# Patient Record
Sex: Female | Born: 1955 | Race: White | Hispanic: No | Marital: Married | Smoking: Former smoker
Health system: Southern US, Community
[De-identification: ages and names within clinical notes are randomized; demographics above are authoritative.]

## PROBLEM LIST (undated history)

## (undated) DIAGNOSIS — K219 Gastro-esophageal reflux disease without esophagitis: Secondary | ICD-10-CM

## (undated) DIAGNOSIS — M329 Systemic lupus erythematosus, unspecified: Secondary | ICD-10-CM

## (undated) DIAGNOSIS — R0600 Dyspnea, unspecified: Secondary | ICD-10-CM

## (undated) DIAGNOSIS — J42 Unspecified chronic bronchitis: Secondary | ICD-10-CM

## (undated) DIAGNOSIS — Z8719 Personal history of other diseases of the digestive system: Secondary | ICD-10-CM

## (undated) DIAGNOSIS — Z972 Presence of dental prosthetic device (complete) (partial): Secondary | ICD-10-CM

## (undated) DIAGNOSIS — J189 Pneumonia, unspecified organism: Secondary | ICD-10-CM

## (undated) DIAGNOSIS — IMO0002 Reserved for concepts with insufficient information to code with codable children: Secondary | ICD-10-CM

## (undated) DIAGNOSIS — M7751 Other enthesopathy of right foot: Secondary | ICD-10-CM

## (undated) DIAGNOSIS — D649 Anemia, unspecified: Secondary | ICD-10-CM

## (undated) DIAGNOSIS — R053 Chronic cough: Secondary | ICD-10-CM

## (undated) DIAGNOSIS — R05 Cough: Secondary | ICD-10-CM

## (undated) DIAGNOSIS — M1990 Unspecified osteoarthritis, unspecified site: Secondary | ICD-10-CM

## (undated) DIAGNOSIS — M7752 Other enthesopathy of left foot: Secondary | ICD-10-CM

## (undated) DIAGNOSIS — Z8711 Personal history of peptic ulcer disease: Secondary | ICD-10-CM

## (undated) HISTORY — PX: FOOT SURGERY: SHX648

## (undated) HISTORY — PX: STOMACH SURGERY: SHX791

---

## 2004-11-17 ENCOUNTER — Ambulatory Visit: Payer: Self-pay

## 2005-06-01 ENCOUNTER — Ambulatory Visit: Payer: Self-pay

## 2006-08-06 ENCOUNTER — Ambulatory Visit: Payer: Self-pay | Admitting: Pain Medicine

## 2008-02-07 ENCOUNTER — Ambulatory Visit: Payer: Self-pay | Admitting: Internal Medicine

## 2010-02-27 HISTORY — PX: CHOLECYSTECTOMY: SHX55

## 2010-12-16 ENCOUNTER — Ambulatory Visit: Payer: Self-pay | Admitting: Otolaryngology

## 2011-03-09 DIAGNOSIS — R0602 Shortness of breath: Secondary | ICD-10-CM | POA: Insufficient documentation

## 2011-03-29 DIAGNOSIS — M5137 Other intervertebral disc degeneration, lumbosacral region: Secondary | ICD-10-CM | POA: Insufficient documentation

## 2011-03-29 DIAGNOSIS — M797 Fibromyalgia: Secondary | ICD-10-CM | POA: Insufficient documentation

## 2011-03-29 DIAGNOSIS — M329 Systemic lupus erythematosus, unspecified: Secondary | ICD-10-CM | POA: Insufficient documentation

## 2011-08-04 DIAGNOSIS — Z79899 Other long term (current) drug therapy: Secondary | ICD-10-CM | POA: Insufficient documentation

## 2012-01-16 DIAGNOSIS — H905 Unspecified sensorineural hearing loss: Secondary | ICD-10-CM | POA: Insufficient documentation

## 2012-01-16 DIAGNOSIS — H6980 Other specified disorders of Eustachian tube, unspecified ear: Secondary | ICD-10-CM | POA: Insufficient documentation

## 2012-03-19 DIAGNOSIS — M791 Myalgia, unspecified site: Secondary | ICD-10-CM | POA: Insufficient documentation

## 2012-03-19 DIAGNOSIS — F341 Dysthymic disorder: Secondary | ICD-10-CM | POA: Insufficient documentation

## 2012-03-19 DIAGNOSIS — J019 Acute sinusitis, unspecified: Secondary | ICD-10-CM | POA: Insufficient documentation

## 2012-08-06 DIAGNOSIS — H66009 Acute suppurative otitis media without spontaneous rupture of ear drum, unspecified ear: Secondary | ICD-10-CM | POA: Insufficient documentation

## 2012-08-14 DIAGNOSIS — M79603 Pain in arm, unspecified: Secondary | ICD-10-CM | POA: Insufficient documentation

## 2012-08-14 DIAGNOSIS — Z0000 Encounter for general adult medical examination without abnormal findings: Secondary | ICD-10-CM | POA: Insufficient documentation

## 2012-08-14 DIAGNOSIS — M722 Plantar fascial fibromatosis: Secondary | ICD-10-CM | POA: Insufficient documentation

## 2012-08-14 DIAGNOSIS — R300 Dysuria: Secondary | ICD-10-CM | POA: Insufficient documentation

## 2012-09-15 DIAGNOSIS — M542 Cervicalgia: Secondary | ICD-10-CM | POA: Insufficient documentation

## 2014-03-24 ENCOUNTER — Encounter: Payer: Self-pay | Admitting: Otolaryngology

## 2014-03-30 ENCOUNTER — Encounter: Payer: Self-pay | Admitting: Otolaryngology

## 2014-04-28 ENCOUNTER — Encounter
Admit: 2014-04-28 | Disposition: A | Discharge status: Home / Self Care | Payer: Self-pay | Attending: Otolaryngology | Admitting: Otolaryngology

## 2014-05-29 ENCOUNTER — Encounter
Admit: 2014-05-29 | Disposition: A | Discharge status: Home / Self Care | Payer: Self-pay | Attending: Otolaryngology | Admitting: Otolaryngology

## 2014-09-29 ENCOUNTER — Ambulatory Visit
Admission: RE | Admit: 2014-09-29 | Discharge: 2014-09-29 | Disposition: A | Discharge status: Home / Self Care | Payer: 59 | Source: Ambulatory Visit | Attending: Otolaryngology | Admitting: Otolaryngology

## 2014-09-29 ENCOUNTER — Other Ambulatory Visit: Payer: Self-pay | Admitting: Otolaryngology

## 2014-09-29 DIAGNOSIS — R05 Cough: Secondary | ICD-10-CM | POA: Insufficient documentation

## 2014-09-29 DIAGNOSIS — R059 Cough, unspecified: Secondary | ICD-10-CM

## 2015-02-28 DIAGNOSIS — J189 Pneumonia, unspecified organism: Secondary | ICD-10-CM

## 2015-02-28 HISTORY — DX: Pneumonia, unspecified organism: J18.9

## 2015-03-10 ENCOUNTER — Other Ambulatory Visit: Payer: Self-pay | Admitting: Orthopedic Surgery

## 2015-03-10 DIAGNOSIS — M5412 Radiculopathy, cervical region: Secondary | ICD-10-CM

## 2015-03-10 DIAGNOSIS — M25512 Pain in left shoulder: Secondary | ICD-10-CM

## 2015-03-16 ENCOUNTER — Ambulatory Visit
Admission: RE | Admit: 2015-03-16 | Discharge: 2015-03-16 | Disposition: A | Discharge status: Home / Self Care | Payer: Commercial Managed Care - HMO | Source: Ambulatory Visit | Attending: Orthopedic Surgery | Admitting: Orthopedic Surgery

## 2015-03-16 DIAGNOSIS — M2578 Osteophyte, vertebrae: Secondary | ICD-10-CM | POA: Diagnosis not present

## 2015-03-16 DIAGNOSIS — M5412 Radiculopathy, cervical region: Secondary | ICD-10-CM | POA: Insufficient documentation

## 2015-03-26 ENCOUNTER — Ambulatory Visit
Admission: RE | Admit: 2015-03-26 | Discharge: 2015-03-26 | Disposition: A | Discharge status: Home / Self Care | Payer: 59 | Source: Ambulatory Visit | Attending: Orthopedic Surgery | Admitting: Orthopedic Surgery

## 2015-03-26 DIAGNOSIS — M25512 Pain in left shoulder: Secondary | ICD-10-CM | POA: Diagnosis present

## 2015-03-26 DIAGNOSIS — M25412 Effusion, left shoulder: Secondary | ICD-10-CM | POA: Insufficient documentation

## 2015-04-27 DIAGNOSIS — M79604 Pain in right leg: Secondary | ICD-10-CM | POA: Insufficient documentation

## 2015-08-11 ENCOUNTER — Ambulatory Visit (INDEPENDENT_AMBULATORY_CARE_PROVIDER_SITE_OTHER): Payer: Commercial Managed Care - HMO | Admitting: Podiatry

## 2015-08-11 ENCOUNTER — Encounter: Payer: Self-pay | Admitting: Podiatry

## 2015-08-11 ENCOUNTER — Ambulatory Visit (INDEPENDENT_AMBULATORY_CARE_PROVIDER_SITE_OTHER): Payer: Commercial Managed Care - HMO

## 2015-08-11 ENCOUNTER — Ambulatory Visit: Payer: Self-pay

## 2015-08-11 VITALS — BP 125/70 | HR 85 | Resp 16

## 2015-08-11 DIAGNOSIS — M779 Enthesopathy, unspecified: Secondary | ICD-10-CM

## 2015-08-11 DIAGNOSIS — M71572 Other bursitis, not elsewhere classified, left ankle and foot: Secondary | ICD-10-CM

## 2015-08-11 DIAGNOSIS — M7752 Other enthesopathy of left foot: Secondary | ICD-10-CM

## 2015-08-11 NOTE — Progress Notes (Signed)
   Subjective:    Patient ID: Brittany Vaughan, female    DOB: 11-Feb-1956, 60 y.o.   MRN: [id number]  HPI: She presents today with a chief complaint of pain and a nodule to the lateral aspect of the left foot. She states that the pain has been increasing over the past several months and the foot appears to be swollen on the outside. She states it is worse with standing and walking really doesn't bother her very much if she is not weightbearing. She denies any trauma to the foot. States that it has worsened since she had surgery to the left foot where she had an implant into her subtalar joint. I requested information regarding the surgery but she declined to get into any because she did not want me to know who did the surgery.    Review of Systems  HENT: Positive for ear pain, hearing loss and tinnitus.   Eyes: Positive for pain and visual disturbance.  Cardiovascular: Positive for leg swelling.  Genitourinary: Positive for frequency.  Musculoskeletal: Positive for myalgias, back pain, arthralgias and gait problem.  Neurological: Positive for dizziness, light-headedness and numbness.  All other systems reviewed and are negative.      Objective:   Physical Exam: Vital signs are stable she is alert and oriented 3 neurologic sensorium is intact deep tendon reflexes are intact muscle strength is intact pulses are intact muscle strength is normal orthopedic evaluation and states all joints distal to the ankle for range of motion without crepitation. She has a very prominent fifth metatarsal with an overlying soft pliable fluctuant lesion consistent with bursitis. There is some mild overlying erythema no cellulitis drainage or odor. 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 There is overlying soft tissue swelling. Cutaneous evaluation demonstrates no erythema cellulitis drainage or odor with exception of that  overlying the fifth metatarsal base. No open lesions or wounds.          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  Plan: I injected the area today with dexamethasone and local anesthetic to help alleviate the bursitis. I recommended orthotics however she is requesting that we removed the device and the subtalar joint. I will see again requested information regarding the surgery itself she declined. I will try to find based on radiographs this device so that we may remove it in the future. I will follow up with her in a month or so reevaluation.

## 2015-08-28 HISTORY — PX: NECK SURGERY: SHX720

## 2016-02-09 ENCOUNTER — Ambulatory Visit
Admission: RE | Admit: 2016-02-09 | Discharge: 2016-02-09 | Disposition: A | Discharge status: Home / Self Care | Payer: Commercial Managed Care - HMO | Source: Ambulatory Visit | Attending: Family Medicine | Admitting: Family Medicine

## 2016-02-09 ENCOUNTER — Other Ambulatory Visit: Payer: Self-pay | Admitting: Family Medicine

## 2016-02-09 DIAGNOSIS — R05 Cough: Secondary | ICD-10-CM

## 2016-02-09 DIAGNOSIS — R059 Cough, unspecified: Secondary | ICD-10-CM

## 2016-04-24 ENCOUNTER — Ambulatory Visit
Admission: RE | Admit: 2016-04-24 | Discharge: 2016-04-24 | Disposition: A | Discharge status: Home / Self Care | Payer: 59 | Source: Ambulatory Visit | Attending: Family Medicine | Admitting: Family Medicine

## 2016-04-24 ENCOUNTER — Other Ambulatory Visit: Payer: Self-pay | Admitting: Family Medicine

## 2016-04-24 DIAGNOSIS — R05 Cough: Secondary | ICD-10-CM | POA: Insufficient documentation

## 2016-04-24 DIAGNOSIS — R059 Cough, unspecified: Secondary | ICD-10-CM

## 2016-06-29 ENCOUNTER — Encounter: Payer: Self-pay | Admitting: *Deleted

## 2016-07-03 NOTE — Discharge Instructions (Signed)
[facility] °DISCHARGE INSTRUCTIONS FOR MYRINGOTOMY AND TUBE INSERTION ° °[facility] °PAUL JUENGEL, M.D. °CHAPMAN T. MCQUEEN, M.D. °SCOTT BENNETT, M.D. °CREIGHTON VAUGHT, M.D. ° °Diet:   After surgery, the patient should take only liquids and foods as tolerated.  The patient may then have a regular diet after the effects of anesthesia have worn off, usually about four to six hours after surgery. ° °Activities:   The patient should rest until the effects of anesthesia have worn off.  After this, there are no restrictions on the normal daily activities. ° °Medications:   You will be given antibiotic drops to be used in the ears postoperatively.  It is recommended to use 4 drops 2 times a day for 4 days, then the drops should be saved for possible future use. ° °The tubes should not cause any discomfort to the patient, but if there is any question, Tylenol should be given according to the instructions for the age of the patient. ° °Other medications should be continued normally. ° °Precautions:   Should there be recurrent drainage after the tubes are placed, the drops should be used for approximately 3-4 days.  If it does not clear, you should call the ENT office. ° °Earplugs:   Earplugs are only needed for those who are going to be submerged under water.  When taking a bath or shower and using a cup or showerhead to rinse hair, it is not necessary to wear earplugs.  These come in a variety of fashions, all of which can be obtained at our office.  However, if one is not able to come by the office, then silicone plugs can be found at most pharmacies.  It is not advised to stick anything in the ear that is not approved as an earplug.  Silly putty is not to be used as an earplug.  Swimming is allowed in patients after ear tubes are inserted, however, they must wear earplugs if they are going to be submerged under water.  For those children who are going to be swimming a lot, it is  recommended to use a fitted ear mold, which can be made by our audiologist.  If discharge is noticed from the ears, this most likely represents an ear infection.  We would recommend getting your eardrops and using them as indicated above.  If it does not clear, then you should call the ENT office.  For follow up, the patient should return to the ENT office three weeks postoperatively and then every six months as required by the doctor. ° ° °General Anesthesia, Adult, Care After °These instructions provide you with information about caring for yourself after your procedure. Your health care provider may also give you more specific instructions. Your treatment has been planned according to current medical practices, but problems sometimes occur. Call your health care provider if you have any problems or questions after your procedure. °What can I expect after the procedure? °After the procedure, it is common to have: °· Vomiting. °· A sore throat. °· Mental slowness. °It is common to feel: °· Nauseous. °· Cold or shivery. °· Sleepy. °· Tired. °· Sore or achy, even in parts of your body where you did not have surgery. °Follow these instructions at home: °For at least 24 hours after the procedure:  °· Do not: °¨ Participate in activities where you could fall or become injured. °¨ Drive. °¨ Use heavy machinery. °¨ Drink alcohol. °¨ Take sleeping pills or medicines   that cause drowsiness. °¨ Make important decisions or sign legal documents. °¨ Take care of children on your own. °· Rest. °Eating and drinking  °· If you vomit, drink water, juice, or soup when you can drink without vomiting. °· Drink enough fluid to keep your urine clear or pale yellow. °· Make sure you have little or no nausea before eating solid foods. °· Follow the diet recommended by your health care provider. °General instructions  °· Have a responsible adult stay with you until you are awake and alert. °· Return to your normal activities as told by your  health care provider. Ask your health care provider what activities are safe for you. °· Take over-the-counter and prescription medicines only as told by your health care provider. °· If you smoke, do not smoke without supervision. °· Keep all follow-up visits as told by your health care provider. This is important. °Contact a health care provider if: °· You continue to have nausea or vomiting at home, and medicines are not helpful. °· You cannot drink fluids or start eating again. °· You cannot urinate after 8-12 hours. °· You develop a skin rash. °· You have fever. °· You have increasing redness at the site of your procedure. °Get help right away if: °· You have difficulty breathing. °· You have chest pain. °· You have unexpected bleeding. °· You feel that you are having a life-threatening or urgent problem. °This information is not intended to replace advice given to you by your health care provider. Make sure you discuss any questions you have with your health care provider. °Document Released: 05/22/2000 Document Revised: 07/19/2015 Document Reviewed: 01/28/2015 °Elsevier Interactive Patient Education © 2017 [employer] ° °

## 2016-07-05 ENCOUNTER — Ambulatory Visit
Admission: RE | Admit: 2016-07-05 | Discharge: 2016-07-05 | Disposition: A | Discharge status: Home / Self Care | Payer: 59 | Source: Ambulatory Visit | Attending: Otolaryngology | Admitting: Otolaryngology

## 2016-07-05 ENCOUNTER — Ambulatory Visit: Payer: 59 | Admitting: Anesthesiology

## 2016-07-05 ENCOUNTER — Encounter
Admission: RE | Disposition: A | Discharge status: Home / Self Care | Payer: Self-pay | Source: Ambulatory Visit | Attending: Otolaryngology

## 2016-07-05 DIAGNOSIS — H6983 Other specified disorders of Eustachian tube, bilateral: Secondary | ICD-10-CM | POA: Diagnosis present

## 2016-07-05 DIAGNOSIS — Z87891 Personal history of nicotine dependence: Secondary | ICD-10-CM | POA: Insufficient documentation

## 2016-07-05 DIAGNOSIS — K219 Gastro-esophageal reflux disease without esophagitis: Secondary | ICD-10-CM | POA: Insufficient documentation

## 2016-07-05 DIAGNOSIS — H73892 Other specified disorders of tympanic membrane, left ear: Secondary | ICD-10-CM | POA: Insufficient documentation

## 2016-07-05 HISTORY — DX: Dyspnea, unspecified: R06.00

## 2016-07-05 HISTORY — DX: Other enthesopathy of right foot and ankle: M77.52

## 2016-07-05 HISTORY — DX: Unspecified osteoarthritis, unspecified site: M19.90

## 2016-07-05 HISTORY — DX: Other enthesopathy of right foot and ankle: M77.51

## 2016-07-05 HISTORY — DX: Reserved for concepts with insufficient information to code with codable children: IMO0002

## 2016-07-05 HISTORY — DX: Systemic lupus erythematosus, unspecified: M32.9

## 2016-07-05 HISTORY — PX: MYRINGOTOMY WITH TUBE PLACEMENT: SHX5663

## 2016-07-05 HISTORY — DX: Gastro-esophageal reflux disease without esophagitis: K21.9

## 2016-07-05 HISTORY — DX: Unspecified chronic bronchitis: J42

## 2016-07-05 HISTORY — DX: Personal history of peptic ulcer disease: Z87.11

## 2016-07-05 HISTORY — DX: Personal history of other diseases of the digestive system: Z87.19

## 2016-07-05 HISTORY — DX: Presence of dental prosthetic device (complete) (partial): Z97.2

## 2016-07-05 SURGERY — MYRINGOTOMY WITH TUBE PLACEMENT
Anesthesia: General | Site: Ear | Laterality: Bilateral | Wound class: Clean Contaminated

## 2016-07-05 MED ORDER — LACTATED RINGERS IV SOLN
INTRAVENOUS | Status: DC
  Administered 2016-07-05: 09:00:00 via INTRAVENOUS

## 2016-07-05 MED ORDER — LIDOCAINE HCL (CARDIAC) 10 MG/ML IV SOLN
INTRAVENOUS | Status: DC | PRN
  Administered 2016-07-05: 40 mg via INTRAVENOUS

## 2016-07-05 MED ORDER — PROPOFOL 10 MG/ML IV BOLUS
INTRAVENOUS | Status: DC | PRN
  Administered 2016-07-05: 100 mg via INTRAVENOUS
  Administered 2016-07-05 (×2): 50 mg via INTRAVENOUS

## 2016-07-05 MED ORDER — ACETAMINOPHEN 325 MG PO TABS: 325.0000 mg | ORAL_TABLET | ORAL | Status: DC | PRN

## 2016-07-05 MED ORDER — ACETAMINOPHEN 160 MG/5ML PO SOLN: 325.0000 mg | ORAL | Status: DC | PRN

## 2016-07-05 MED ORDER — CIPROFLOXACIN-DEXAMETHASONE 0.3-0.1 % OT SUSP
OTIC | Status: DC | PRN
  Administered 2016-07-05: 4 [drp] via OTIC

## 2016-07-05 SURGICAL SUPPLY — 11 items

## 2016-07-05 NOTE — H&P (Signed)
..  History and Physical paper copy reviewed and updated date of procedure and will be scanned into system.  Patient seen and examined.  

## 2016-07-05 NOTE — Anesthesia Postprocedure Evaluation (Signed)
Anesthesia Post Note  Patient: Brittany HawkingLinda S Vaughan  Procedure(s) Performed: Procedure(s) (LRB): MYRINGOTOMY WITH TUBE PLACEMENT WITH BUTTERFLY (Bilateral)  Patient location during evaluation: PACU Anesthesia Type: General Level of consciousness: awake and alert Pain management: pain level controlled Vital Signs Assessment: post-procedure vital signs reviewed and stable Respiratory status: spontaneous breathing, nonlabored ventilation, respiratory function stable and patient connected to nasal cannula oxygen Cardiovascular status: blood pressure returned to baseline and stable Postop Assessment: no signs of nausea or vomiting Anesthetic complications: no    Alta CorningBacon, Clarity Ciszek S

## 2016-07-05 NOTE — Anesthesia Procedure Notes (Signed)
Performed by: Andee PolesBUSH, Maira Christon Pre-anesthesia Checklist: Patient identified, Emergency Drugs available, Suction available, Timeout performed and Patient being monitored Patient Re-evaluated:Patient Re-evaluated prior to inductionOxygen Delivery Method: Circle system utilized Preoxygenation: Pre-oxygenation with 100% oxygen Intubation Type: Combination inhalational/ intravenous induction Ventilation: Mask ventilation without difficulty and Mask ventilation throughout procedure Dental Injury: Teeth and Oropharynx as per pre-operative assessment

## 2016-07-05 NOTE — Anesthesia Preprocedure Evaluation (Signed)
Anesthesia Evaluation  Patient identified by MRN, date of birth, ID band Patient awake    Reviewed: Allergy & Precautions, H&P , NPO status , Patient's Chart, lab work & pertinent test results, reviewed documented beta blocker date and time   Airway Mallampati: I  TM Distance: >3 FB Neck ROM: full    Dental  (+) Partial Upper   Pulmonary shortness of breath and with exertion, former smoker,    + rhonchi        Cardiovascular Exercise Tolerance: Good negative cardio ROS Normal cardiovascular exam Rhythm:regular Rate:Normal     Neuro/Psych negative neurological ROS  negative psych ROS   GI/Hepatic Neg liver ROS, GERD  Poorly Controlled,Reflux seems to be "ok" today.   Endo/Other  negative endocrine ROS  Renal/GU negative Renal ROS  negative genitourinary   Musculoskeletal   Abdominal   Peds  Hematology negative hematology ROS (+)   Anesthesia Other Findings   Reproductive/Obstetrics negative OB ROS                             Anesthesia Physical Anesthesia Plan  ASA: III  Anesthesia Plan: General   Post-op Pain Management:    Induction:   Airway Management Planned:   Additional Equipment:   Intra-op Plan:   Post-operative Plan:   Informed Consent: I have reviewed the patients History and Physical, chart, labs and discussed the procedure including the risks, benefits and alternatives for the proposed anesthesia with the patient or authorized representative who has indicated his/her understanding and acceptance.   Dental Advisory Given  Plan Discussed with: CRNA and Anesthesiologist  Anesthesia Plan Comments:         Anesthesia Quick Evaluation
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
## 2016-07-05 NOTE — Transfer of Care (Signed)
Immediate Anesthesia Transfer of Care Note  Patient: Brittany HawkingLinda S Vaughan  Procedure(s) Performed: Procedure(s) with comments: MYRINGOTOMY WITH TUBE PLACEMENT WITH BUTTERFLY (Bilateral) - BUTTERFLY TUBES  Patient Location: PACU  Anesthesia Type: General  Level of Consciousness: awake, alert  and patient cooperative  Airway and Oxygen Therapy: Patient Spontanous Breathing and Patient connected to supplemental oxygen  Post-op Assessment: Post-op Vital signs reviewed, Patient's Cardiovascular Status Stable, Respiratory Function Stable, Patent Airway and No signs of Nausea or vomiting  Post-op Vital Signs: Reviewed and stable  Complications: No apparent anesthesia complications

## 2016-07-05 NOTE — Op Note (Signed)
..  07/05/2016  9:41 AM    Brittany Vaughan, Brittany Vaughan  [id number]   Pre-Op Dx:  EUSTACHIAN TUBE DYSFUNCTION  Post-op Dx: EUSTACHIAN TUBE DYSFUNCTION  Proc:Bilateral myringotomy with butterfly tubes  Surg: Graciella Arment  Anes:  General by mask  EBL:  None  Comp:  None  Findings:  Right mucoid effusion, left retraction with myringosclerosis  Procedure: With the patient in a comfortable supine position, general mask anesthesia was administered.  At an appropriate level, microscope and speculum were used to examine and clean the RIGHT ear canal.  The findings were as described above.  An anterior inferior radial myringotomy incision was sharply executed.  Middle ear contents were suctioned clear with a size 5 otologic suction.  A butterfly PE tube was placed without difficulty using a Rosen pick and Facilities manageralligator.  Ciprodex otic solution was instilled into the external canal, and insufflated into the middle ear.  A cotton ball was placed at the external meatus. Hemostasis was observed.  This side was completed.  After completing the RIGHT side, the LEFT side was done in identical fashion except the myringotomy was placed in a posterior-inferior position.  Following this  The patient was returned to anesthesia, awakened, and transferred to recovery in stable condition.  Dispo:  PACU to home  Plan: Routine drop use and water precautions.  Recheck my office three weeks.   [name] 9:41 AM 07/05/2016

## 2016-07-06 ENCOUNTER — Encounter: Payer: Self-pay | Admitting: Otolaryngology

## 2016-11-21 ENCOUNTER — Ambulatory Visit: Payer: Self-pay | Admitting: Orthopedic Surgery

## 2016-12-04 ENCOUNTER — Encounter
Admission: RE | Admit: 2016-12-04 | Discharge: 2016-12-04 | Disposition: A | Discharge status: Home / Self Care | Payer: 59 | Source: Ambulatory Visit | Attending: Orthopedic Surgery | Admitting: Orthopedic Surgery

## 2016-12-04 DIAGNOSIS — Z01810 Encounter for preprocedural cardiovascular examination: Secondary | ICD-10-CM | POA: Insufficient documentation

## 2016-12-04 DIAGNOSIS — R002 Palpitations: Secondary | ICD-10-CM | POA: Insufficient documentation

## 2016-12-04 HISTORY — DX: Pneumonia, unspecified organism: J18.9

## 2016-12-04 HISTORY — DX: Anemia, unspecified: D64.9

## 2016-12-04 HISTORY — DX: Chronic cough: R05.3

## 2016-12-04 HISTORY — DX: Cough: R05

## 2016-12-04 NOTE — Patient Instructions (Signed)
Your procedure is scheduled on: 12-11-16 MONDAY Report to Same Day Surgery 2nd floor medical mall [facility] Entrance-take elevator on left to 2nd floor.  Check in with surgery information desk.) To find out your arrival time please call [phone] between 1PM - 3PM on 12-08-16 FRIDAY  Remember: Instructions that are not followed completely may result in serious medical risk, up to and including death, or upon the discretion of your surgeon and anesthesiologist your surgery may need to be rescheduled.    _x___ 1. Do not eat food after midnight the night before your procedure. NO GUM CHEWING OR HARD CANDIES.  You may drink clear liquids up to 2 hours before you are scheduled to arrive at the hospital for your procedure.  Do not drink clear liquids within 2 hours of your scheduled arrival to the hospital.  Clear liquids include  --Water or Apple juice without pulp  --Clear carbohydrate beverage such as ClearFast or Gatorade  --Black Coffee or Clear Tea (No milk, no creamers, do not add anything to  the coffee or Tea)  Type 1 and type 2 diabetics should only drink water. .     __x__ 2. No Alcohol for 24 hours before or after surgery.   __x__3. No Smoking for 24 prior to surgery.   ____  4. Bring all medications with you on the day of surgery if instructed.    __x__ 5. Notify your doctor if there is any change in your medical condition     (cold, fever, infections).     Do not wear jewelry, make-up, hairpins, clips or nail polish.  Do not wear lotions, powders, or perfumes. You may wear deodorant.  Do not shave 48 hours prior to surgery. Men may shave face and neck.  Do not bring valuables to the hospital.    [facility] is not responsible for any belongings or valuables.               Contacts, dentures or bridgework may not be worn into surgery.  Leave your suitcase in the car. After surgery it may be brought to your room.  For patients admitted to the hospital, discharge time  is determined by your  treatment team.   Patients discharged the day of surgery will not be allowed to drive home.  You will need someone to drive you home and stay with you the night of your procedure.    Please read over the following fact sheets that you were given:   [facility] Preparing for Surgery and or MRSA Information   ____ TAKE THE FOLLOWING MEDICATIONS THE MORNING OF SURGERY WITH A SMALL SIP OF WATER. These include:  1. NONE  2.  3.  4.  5.  6.  ____Fleets enema or Magnesium Citrate as directed.   _x___ Use CHG Soap or sage wipes as directed on instruction sheet   __X__ Use inhalers on the day of surgery and bring to hospital day of surgery-USE ALBUTEROL INHALER AT HOME DAY OF SURGERY AND BRING INHALER TO HOSPITAL  ____ Stop Metformin and Janumet 2 days prior to surgery.    ____ Take 1/2 of usual insulin dose the night before surgery and none on the morning surgery.   ____ Follow recommendations from Cardiologist, Pulmonologist or PCP regarding stopping Aspirin, Coumadin, Plavix ,Eliquis, Effient, or Pradaxa, and Pletal.  X____Stop Anti-inflammatories such as Advil, Aleve, IBUPROFEN, Motrin, Naproxen, Naprosyn, Goodies powders or aspirin products NOW- OK to take Tylenol    ____ Stop  supplements until after surgery.     ____ Bring C-Pap to the hospital.
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
## 2016-12-06 ENCOUNTER — Encounter
Admission: RE | Admit: 2016-12-06 | Discharge: 2016-12-06 | Disposition: A | Discharge status: Home / Self Care | Payer: 59 | Source: Ambulatory Visit | Attending: Orthopedic Surgery | Admitting: Orthopedic Surgery

## 2016-12-06 DIAGNOSIS — R002 Palpitations: Secondary | ICD-10-CM | POA: Diagnosis not present

## 2016-12-06 DIAGNOSIS — Z01810 Encounter for preprocedural cardiovascular examination: Secondary | ICD-10-CM | POA: Diagnosis present

## 2016-12-06 NOTE — Pre-Procedure Instructions (Signed)
DG Chest 2 View (Accession [id number]) (Order [id number])  Imaging  Date: 04/24/2016 Department: [facility] Released By: Vilinda Boehringer, RT Authorizing: Dortha Kern, MD  Exam Information   Status Exam Begun  Exam Ended   Final [99] 04/24/2016 11:17 AM 04/24/2016 11:21 AM  PACS Images   Show images for DG Chest 2 View  Study Result   CLINICAL DATA:  Episode of pneumonia in early December 2017. Since that time she has had intermittent productive cough, shortness of breath, chills and diaphoresis, fever, and upper chest pain. The patient has undergone 3 courses of antibiotics. Remote history of smoking.  EXAM: CHEST  2 VIEW  COMPARISON:  Chest x-rays of February 09, 2016 and September 29, 2014  FINDINGS: The lungs are well-expanded. There is no focal infiltrate. There is no pleural effusion. The heart and pulmonary vascularity are normal. The mediastinum is normal in width. The bony thorax exhibits no acute abnormality.  IMPRESSION: There is no active cardiopulmonary disease demonstrated on today's study. Given the patient's chronic symptoms, chest CT scanning would be a useful next imaging step.   Electronically Signed   By: David  [place] M.D.   On: 04/24/2016 11:28   Order-Level Documents:   There are no order-level documents.  Encounter-Level Documents:   There are no encounter-level documents.  Vitals   Height Weight BMI (Calculated)      External Results Report   Open External Results Report  Imaging   Imaging Information  Signed by   Signed Date/Time  Phone Pager  [place], DAVID A 04/24/2016 11:28 AM [phone]   Signed   Electronically signed by [place], David A, MD on 04/24/16 at 1128 EST  Study Notes     Vilinda Boehringer, RT on 04/24/2016 11:19 AM  Pt has had pneumonia on dec 1st 2017, she has had prod cough with fever, sob, sweating/chills and upper chest pain off and on since. S/p abx x 3 times. exsmoker x 40 years  ago.    Original Order   Ordered On Ordered By   04/24/2016 11:11 AM Vilinda Boehringer, RT

## 2016-12-08 NOTE — Pre-Procedure Instructions (Signed)
Medical Clearance on chart from Dr Jeannetta Nap Risk

## 2016-12-11 ENCOUNTER — Ambulatory Visit: Payer: 59 | Admitting: Anesthesiology

## 2016-12-11 ENCOUNTER — Encounter: Payer: Self-pay | Admitting: *Deleted

## 2016-12-11 ENCOUNTER — Encounter
Admission: RE | Disposition: A | Discharge status: Home / Self Care | Payer: Self-pay | Source: Ambulatory Visit | Attending: Orthopedic Surgery

## 2016-12-11 ENCOUNTER — Ambulatory Visit
Admission: RE | Admit: 2016-12-11 | Discharge: 2016-12-11 | Disposition: A | Discharge status: Home / Self Care | Payer: 59 | Source: Ambulatory Visit | Attending: Orthopedic Surgery | Admitting: Orthopedic Surgery

## 2016-12-11 DIAGNOSIS — J449 Chronic obstructive pulmonary disease, unspecified: Secondary | ICD-10-CM | POA: Insufficient documentation

## 2016-12-11 DIAGNOSIS — M7552 Bursitis of left shoulder: Secondary | ICD-10-CM | POA: Insufficient documentation

## 2016-12-11 DIAGNOSIS — Z87891 Personal history of nicotine dependence: Secondary | ICD-10-CM | POA: Insufficient documentation

## 2016-12-11 DIAGNOSIS — M7582 Other shoulder lesions, left shoulder: Secondary | ICD-10-CM | POA: Insufficient documentation

## 2016-12-11 DIAGNOSIS — M75122 Complete rotator cuff tear or rupture of left shoulder, not specified as traumatic: Secondary | ICD-10-CM | POA: Diagnosis not present

## 2016-12-11 DIAGNOSIS — M797 Fibromyalgia: Secondary | ICD-10-CM | POA: Diagnosis not present

## 2016-12-11 DIAGNOSIS — Z882 Allergy status to sulfonamides status: Secondary | ICD-10-CM | POA: Diagnosis not present

## 2016-12-11 DIAGNOSIS — Z79899 Other long term (current) drug therapy: Secondary | ICD-10-CM | POA: Diagnosis not present

## 2016-12-11 DIAGNOSIS — F329 Major depressive disorder, single episode, unspecified: Secondary | ICD-10-CM | POA: Insufficient documentation

## 2016-12-11 DIAGNOSIS — K769 Liver disease, unspecified: Secondary | ICD-10-CM | POA: Insufficient documentation

## 2016-12-11 DIAGNOSIS — M1990 Unspecified osteoarthritis, unspecified site: Secondary | ICD-10-CM | POA: Insufficient documentation

## 2016-12-11 DIAGNOSIS — K219 Gastro-esophageal reflux disease without esophagitis: Secondary | ICD-10-CM | POA: Diagnosis not present

## 2016-12-11 HISTORY — PX: SHOULDER ARTHROSCOPY WITH OPEN ROTATOR CUFF REPAIR: SHX6092

## 2016-12-11 SURGERY — ARTHROSCOPY, SHOULDER WITH REPAIR, ROTATOR CUFF, OPEN
Anesthesia: General | Laterality: Left | Wound class: Clean

## 2016-12-11 MED ORDER — LIDOCAINE HCL (CARDIAC) 20 MG/ML IV SOLN
INTRAVENOUS | Status: DC | PRN
  Administered 2016-12-11: 60 mg via INTRAVENOUS

## 2016-12-11 MED ORDER — FENTANYL CITRATE (PF) 100 MCG/2ML IJ SOLN: 25.0000 ug | INTRAMUSCULAR | Status: DC | PRN

## 2016-12-11 MED ORDER — SEVOFLURANE IN SOLN
RESPIRATORY_TRACT | Status: AC
  Filled 2016-12-11: qty 250

## 2016-12-11 MED ORDER — CEFAZOLIN SODIUM-DEXTROSE 2-4 GM/100ML-% IV SOLN
INTRAVENOUS | Status: AC
  Filled 2016-12-11: qty 100

## 2016-12-11 MED ORDER — OXYCODONE HCL 5 MG/5ML PO SOLN: 5.0000 mg | Freq: Once | ORAL | Status: AC | PRN

## 2016-12-11 MED ORDER — GLYCOPYRROLATE 0.2 MG/ML IJ SOLN
INTRAMUSCULAR | Status: DC | PRN
  Administered 2016-12-11: 0.2 mg via INTRAVENOUS

## 2016-12-11 MED ORDER — GABAPENTIN 300 MG PO CAPS
ORAL_CAPSULE | ORAL | Status: AC
  Administered 2016-12-11: 300 mg via ORAL
  Filled 2016-12-11: qty 1

## 2016-12-11 MED ORDER — ACETAMINOPHEN 325 MG PO TABS
ORAL_TABLET | ORAL | Status: AC
  Administered 2016-12-11: 1000 mg via ORAL
  Filled 2016-12-11: qty 3

## 2016-12-11 MED ORDER — ROPIVACAINE HCL 5 MG/ML IJ SOLN
INTRAMUSCULAR | Status: AC
  Filled 2016-12-11: qty 30

## 2016-12-11 MED ORDER — ONDANSETRON HCL 4 MG/2ML IJ SOLN
INTRAMUSCULAR | Status: DC | PRN
  Administered 2016-12-11: 4 mg via INTRAVENOUS

## 2016-12-11 MED ORDER — FAMOTIDINE 20 MG PO TABS
ORAL_TABLET | ORAL | Status: AC
  Administered 2016-12-11: 20 mg via ORAL
  Filled 2016-12-11: qty 1

## 2016-12-11 MED ORDER — PROPOFOL 10 MG/ML IV BOLUS
INTRAVENOUS | Status: AC
  Filled 2016-12-11: qty 20

## 2016-12-11 MED ORDER — ROPIVACAINE HCL 5 MG/ML IJ SOLN
INTRAMUSCULAR | Status: DC | PRN
  Administered 2016-12-11: 10 mL via PERINEURAL
  Administered 2016-12-11: 20 mL via PERINEURAL

## 2016-12-11 MED ORDER — ACETAMINOPHEN 500 MG PO TABS
1000.0000 mg | ORAL_TABLET | Freq: Once | ORAL | Status: AC
  Administered 2016-12-11: 1000 mg via ORAL

## 2016-12-11 MED ORDER — FENTANYL CITRATE (PF) 100 MCG/2ML IJ SOLN
INTRAMUSCULAR | Status: DC | PRN
  Administered 2016-12-11: 100 ug via INTRAVENOUS

## 2016-12-11 MED ORDER — FENTANYL CITRATE (PF) 100 MCG/2ML IJ SOLN
INTRAMUSCULAR | Status: AC
  Administered 2016-12-11: 50 ug via INTRAVENOUS
  Filled 2016-12-11: qty 2

## 2016-12-11 MED ORDER — LIDOCAINE HCL (PF) 2 % IJ SOLN
INTRAMUSCULAR | Status: AC
  Filled 2016-12-11: qty 10

## 2016-12-11 MED ORDER — FENTANYL CITRATE (PF) 100 MCG/2ML IJ SOLN
50.0000 ug | Freq: Once | INTRAMUSCULAR | Status: AC
  Administered 2016-12-11: 50 ug via INTRAVENOUS

## 2016-12-11 MED ORDER — FENTANYL CITRATE (PF) 100 MCG/2ML IJ SOLN
INTRAMUSCULAR | Status: AC
  Filled 2016-12-11: qty 2

## 2016-12-11 MED ORDER — ONDANSETRON HCL 4 MG/2ML IJ SOLN
INTRAMUSCULAR | Status: AC
  Administered 2016-12-11: 4 mg via INTRAVENOUS
  Filled 2016-12-11: qty 2

## 2016-12-11 MED ORDER — MIDAZOLAM HCL 2 MG/2ML IJ SOLN
INTRAMUSCULAR | Status: AC
  Administered 2016-12-11: 1 mg via INTRAVENOUS
  Filled 2016-12-11: qty 2

## 2016-12-11 MED ORDER — CEFAZOLIN SODIUM-DEXTROSE 2-4 GM/100ML-% IV SOLN
2.0000 g | INTRAVENOUS | Status: AC
  Administered 2016-12-11: 2 g via INTRAVENOUS

## 2016-12-11 MED ORDER — DEXAMETHASONE SODIUM PHOSPHATE 10 MG/ML IJ SOLN
INTRAMUSCULAR | Status: DC | PRN
  Administered 2016-12-11: 10 mg via INTRAVENOUS

## 2016-12-11 MED ORDER — CHLORHEXIDINE GLUCONATE 4 % EX LIQD: 60.0000 mL | Freq: Once | CUTANEOUS | Status: DC

## 2016-12-11 MED ORDER — DOCUSATE SODIUM 100 MG PO CAPS: 100.0000 mg | ORAL_CAPSULE | Freq: Every day | ORAL | 2 refills | Status: AC | PRN

## 2016-12-11 MED ORDER — MIDAZOLAM HCL 2 MG/2ML IJ SOLN
1.0000 mg | Freq: Once | INTRAMUSCULAR | Status: AC
  Administered 2016-12-11: 1 mg via INTRAVENOUS

## 2016-12-11 MED ORDER — SENNA 8.6 MG PO TABS: 1.0000 | ORAL_TABLET | Freq: Every day | ORAL | 0 refills | Status: AC

## 2016-12-11 MED ORDER — SUGAMMADEX SODIUM 200 MG/2ML IV SOLN
INTRAVENOUS | Status: AC
  Filled 2016-12-11: qty 2

## 2016-12-11 MED ORDER — PHENYLEPHRINE HCL 10 MG/ML IJ SOLN
INTRAMUSCULAR | Status: DC | PRN
  Administered 2016-12-11 (×10): 100 ug via INTRAVENOUS

## 2016-12-11 MED ORDER — GABAPENTIN 300 MG PO CAPS
300.0000 mg | ORAL_CAPSULE | Freq: Once | ORAL | Status: AC
  Administered 2016-12-11: 300 mg via ORAL

## 2016-12-11 MED ORDER — DEXAMETHASONE SODIUM PHOSPHATE 10 MG/ML IJ SOLN
INTRAMUSCULAR | Status: AC
Start: 2016-12-11 — End: 2016-12-11
  Filled 2016-12-11: qty 1

## 2016-12-11 MED ORDER — OXYCODONE HCL 5 MG PO TABS
5.0000 mg | ORAL_TABLET | Freq: Once | ORAL | Status: AC | PRN
  Administered 2016-12-11: 5 mg via ORAL

## 2016-12-11 MED ORDER — ROCURONIUM BROMIDE 50 MG/5ML IV SOLN
INTRAVENOUS | Status: AC
  Filled 2016-12-11: qty 1

## 2016-12-11 MED ORDER — FAMOTIDINE 20 MG PO TABS
20.0000 mg | ORAL_TABLET | Freq: Once | ORAL | Status: AC
  Administered 2016-12-11: 20 mg via ORAL

## 2016-12-11 MED ORDER — EPINEPHRINE 30 MG/30ML IJ SOLN
INTRAMUSCULAR | Status: AC
  Filled 2016-12-11: qty 1

## 2016-12-11 MED ORDER — HYDROCODONE-ACETAMINOPHEN 5-325 MG PO TABS: 1.0000 | ORAL_TABLET | ORAL | 0 refills | Status: AC | PRN

## 2016-12-11 MED ORDER — OXYCODONE HCL 5 MG PO TABS
ORAL_TABLET | ORAL | Status: DC
Start: 2016-12-11 — End: 2016-12-11
  Filled 2016-12-11: qty 1

## 2016-12-11 MED ORDER — SODIUM CHLORIDE 0.9 % IJ SOLN
INTRAMUSCULAR | Status: AC
  Filled 2016-12-11: qty 50

## 2016-12-11 MED ORDER — LACTATED RINGERS IV SOLN
INTRAVENOUS | Status: DC
  Administered 2016-12-11: 09:00:00 via INTRAVENOUS

## 2016-12-11 MED ORDER — LIDOCAINE HCL (PF) 1 % IJ SOLN
INTRAMUSCULAR | Status: AC
  Filled 2016-12-11: qty 5

## 2016-12-11 MED ORDER — LIDOCAINE HCL (PF) 1 % IJ SOLN
INTRAMUSCULAR | Status: DC | PRN
  Administered 2016-12-11: 1 mL via INTRADERMAL

## 2016-12-11 MED ORDER — BUPIVACAINE-EPINEPHRINE (PF) 0.25% -1:200000 IJ SOLN
INTRAMUSCULAR | Status: AC
  Filled 2016-12-11: qty 30

## 2016-12-11 MED ORDER — PROPOFOL 10 MG/ML IV BOLUS
INTRAVENOUS | Status: DC | PRN
  Administered 2016-12-11: 140 mg via INTRAVENOUS

## 2016-12-11 MED ORDER — ROCURONIUM BROMIDE 100 MG/10ML IV SOLN
INTRAVENOUS | Status: DC | PRN
  Administered 2016-12-11: 50 mg via INTRAVENOUS

## 2016-12-11 MED ORDER — SUCCINYLCHOLINE CHLORIDE 20 MG/ML IJ SOLN
INTRAMUSCULAR | Status: DC | PRN
  Administered 2016-12-11: 100 mg via INTRAVENOUS

## 2016-12-11 MED ORDER — ONDANSETRON HCL 4 MG/2ML IJ SOLN
4.0000 mg | Freq: Once | INTRAMUSCULAR | Status: AC | PRN
  Administered 2016-12-11: 4 mg via INTRAVENOUS

## 2016-12-11 MED ORDER — ONDANSETRON HCL 4 MG/2ML IJ SOLN
INTRAMUSCULAR | Status: AC
  Filled 2016-12-11: qty 2

## 2016-12-11 MED ORDER — ACETAMINOPHEN 500 MG PO TABS
ORAL_TABLET | ORAL | Status: AC
  Filled 2016-12-11: qty 2

## 2016-12-11 MED ORDER — SUGAMMADEX SODIUM 200 MG/2ML IV SOLN
INTRAVENOUS | Status: DC | PRN
  Administered 2016-12-11: 145.2 mg via INTRAVENOUS

## 2016-12-11 SURGICAL SUPPLY — 74 items
ADAPTER IRRIG TUBE 2 SPIKE SOL (ADAPTER) ×6 IMPLANT
ANCHOR SUT BIO SW 4.75X19.1 (Anchor) ×3 IMPLANT
BASIN GRAD PLASTIC 32OZ STRL (MISCELLANEOUS) ×3 IMPLANT
BLADE INCISOR PLUS 4.5 (BLADE) ×3 IMPLANT
BLADE MINI RND TIP GREEN BEAV (BLADE) ×3 IMPLANT
BLADE SURG 15 STRL LF DISP TIS (BLADE) IMPLANT
BLADE SURG 15 STRL SS (BLADE)
BRUSH SCRUB EZ  4% CHG (MISCELLANEOUS) ×2
BRUSH SCRUB EZ 4% CHG (MISCELLANEOUS) ×1 IMPLANT
BUR BR 5.5 WIDE MOUTH (BURR) ×3 IMPLANT
CANNULA 5.75X7 CRYSTAL CLEAR (CANNULA) ×3 IMPLANT
CANNULA PARTIAL THREAD 2X7 (CANNULA) ×3 IMPLANT
CANNULA TWIST IN 8.25X9CM (CANNULA) ×3 IMPLANT
CHLORAPREP W/TINT 26ML (MISCELLANEOUS) ×3 IMPLANT
CLOSURE WOUND 1/4X4 (GAUZE/BANDAGES/DRESSINGS)
COOLER POLAR GLACIER W/PUMP (MISCELLANEOUS) ×3 IMPLANT
CRADLE LAMINECT ARM (MISCELLANEOUS) ×3 IMPLANT
DEVICE SUCT BLK HOLE OR FLOOR (MISCELLANEOUS) ×3 IMPLANT
DRAPE IMP U-DRAPE 54X76 (DRAPES) ×6 IMPLANT
DRAPE INCISE IOBAN 66X45 STRL (DRAPES) ×3 IMPLANT
DRAPE SHEET LG 3/4 BI-LAMINATE (DRAPES) ×3 IMPLANT
DRAPE STERI 35X30 U-POUCH (DRAPES) ×3 IMPLANT
DRAPE U-SHAPE 47X51 STRL (DRAPES) ×3 IMPLANT
ELECT REM PT RETURN 9FT ADLT (ELECTROSURGICAL) ×3
ELECTRODE REM PT RTRN 9FT ADLT (ELECTROSURGICAL) ×1 IMPLANT
GAUZE PETRO XEROFOAM 1X8 (MISCELLANEOUS) ×3 IMPLANT
GAUZE SPONGE 4X4 12PLY STRL (GAUZE/BANDAGES/DRESSINGS) ×3 IMPLANT
GLOVE BIOGEL PI IND STRL 8.5 (GLOVE) ×1 IMPLANT
GLOVE BIOGEL PI INDICATOR 8.5 (GLOVE) ×2
GLOVE SURG ORTHO 8.0 STRL STRW (GLOVE) ×3 IMPLANT
GOWN STRL REUS W/ TWL LRG LVL3 (GOWN DISPOSABLE) ×1 IMPLANT
GOWN STRL REUS W/ TWL XL LVL3 (GOWN DISPOSABLE) ×1 IMPLANT
GOWN STRL REUS W/TWL LRG LVL3 (GOWN DISPOSABLE) ×2
GOWN STRL REUS W/TWL XL LVL3 (GOWN DISPOSABLE) ×2
IV LACTATED RINGER IRRG 3000ML (IV SOLUTION) ×12
IV LR IRRIG 3000ML ARTHROMATIC (IV SOLUTION) ×6 IMPLANT
KIT RM TURNOVER STRD PROC AR (KITS) ×3 IMPLANT
KIT STABILIZATION SHOULDER (MISCELLANEOUS) ×3 IMPLANT
MANIFOLD 4PT FOR NEPTUNE1 (MISCELLANEOUS) ×3 IMPLANT
MANIFOLD NEPTUNE II (INSTRUMENTS) ×3 IMPLANT
MASK FACE SPIDER DISP (MASK) ×3 IMPLANT
MAT BLUE FLOOR 46X72 FLO (MISCELLANEOUS) ×3 IMPLANT
NDL SAFETY 18GX1.5 (NEEDLE) ×3 IMPLANT
NDL SAFETY 22GX1.5 (NEEDLE) ×3 IMPLANT
NDL SUREFIRE SCORPION (NEEDLE) ×3 IMPLANT
NEEDLE SPNL 18GX3.5 QUINCKE PK (NEEDLE) ×3 IMPLANT
NS IRRIG 1000ML POUR BTL (IV SOLUTION) IMPLANT
PACK ARTHROSCOPY SHOULDER (MISCELLANEOUS) ×3 IMPLANT
PAD ABD DERMACEA PRESS 5X9 (GAUZE/BANDAGES/DRESSINGS) IMPLANT
PAD WRAPON POLAR SHDR XLG (MISCELLANEOUS) ×1 IMPLANT
SET TUBE SUCT SHAVER OUTFL 24K (TUBING) ×3 IMPLANT
SET TUBE TIP INTRA-ARTICULAR (MISCELLANEOUS) ×3 IMPLANT
SLING ARM LRG DEEP (SOFTGOODS) IMPLANT
SLING ULTRA II M (MISCELLANEOUS) ×3 IMPLANT
SPONGE XRAY 4X4 16PLY STRL (MISCELLANEOUS) IMPLANT
STAPLER SKIN PROX 35W (STAPLE) IMPLANT
STRAP SAFETY BODY (MISCELLANEOUS) ×3 IMPLANT
STRIP CLOSURE SKIN 1/4X4 (GAUZE/BANDAGES/DRESSINGS) IMPLANT
SUT ETHILON NAB PS2 4-0 18IN (SUTURE) ×3 IMPLANT
SUT FIBERWIRE #2 38 T-5 BLUE (SUTURE)
SUT FIBERWIRE #5 38 BLUE (WIRE) ×3 IMPLANT
SUT PDS AB 0 CT1 27 (SUTURE) ×3 IMPLANT
SUT TIGER TAPE 7 IN WHITE (SUTURE) IMPLANT
SUT VIC AB 4-0 PS2 18 (SUTURE) IMPLANT
SUTURE FIBERWR #2 38 T-5 BLUE (SUTURE) IMPLANT
SYRINGE 10CC LL (SYRINGE) ×3 IMPLANT
TAPE MICROFOAM 4IN (TAPE) ×3 IMPLANT
TUBING ARTHRO INFLOW-ONLY STRL (TUBING) ×3 IMPLANT
TUBING CONNECTING 10 (TUBING) ×2 IMPLANT
TUBING CONNECTING 10' (TUBING) ×1
WAND 90 DEG TURBOVAC W/CORD (SURGICAL WAND) ×3 IMPLANT
WAND HAND CNTRL MULTIVAC 90 (MISCELLANEOUS) ×3 IMPLANT
WIRE FIBER 2 (WIRE) IMPLANT
WRAPON POLAR PAD SHDR XLG (MISCELLANEOUS) ×3

## 2016-12-11 NOTE — Anesthesia Procedure Notes (Addendum)
Anesthesia Regional Block: Interscalene brachial plexus block   Pre-Anesthetic Checklist: ,, timeout performed, Correct Patient, Correct Site, Correct Laterality, Correct Procedure, Correct Position, site marked, Risks and benefits discussed,  Surgical consent,  Pre-op evaluation,  At surgeon's request and post-op pain management  Laterality: Upper and Left  Prep: chloraprep       Needles:  Injection technique: Single-shot  Needle Type: Stimiplex     Needle Length: 5cm  Needle Gauge: 22     Additional Needles:   Narrative:  Start time: 12/11/2016 9:42 AM End time: 12/11/2016 9:44 AM Injection made incrementally with aspirations every 5 mL.  Performed by: Personally  Anesthesiologist: Margorie John K  Additional Notes: Patient endorses baseline weakness in left shoulder, arm and hand at baseline pre block.  Functioning IV was confirmed and monitors were applied.  A 50mm 22ga Stimuplex needle was used. Sterile prep,hand hygiene and sterile gloves were used.  Minimal sedation used for procedure.  No paresthesia endorsed by patient during the procedure.  Negative aspiration and negative test dose prior to incremental administration of local anesthetic. The patient tolerated the procedure well with no immediate complications.

## 2016-12-11 NOTE — Op Note (Signed)
12/11/2016  1:53 PM  PATIENT:  Brittany Vaughan  61 y.o. 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    The risks benefits and alternatives were discussed with the patient preoperatively including but not limited to the risks of infection, bleeding, nerve injury, persistent pain or weakness, failure of the hardware, re-tear of the rotator cuff and the need for further surgery. Medical risks include DVT and pulmonary embolism, myocardial infarction, stroke, pneumonia, respiratory failure and death. Patient understood these risks and wished to proceed.  OPERATIVE IMPLANTS: Ar[employer]rex SpeedBridge System  OPERATIVE PROCEDURE: The patient was met in the preoperative area. The LEFT shoulder was signed with my initials according the hospital's correct site of surgery protocol. The patient is brought to the OR and HAD underwent a supraclavicular block and general endotracheal intubation by the anesthesia service.  The patient was placed in a beachchair position. A spider arm positioner was used for this case. Examination under anesthesia revealed a partial tear of the biceps, degenerative labrum, mild degenerative changes within the glenoid and humeral head and synovitis. A large undersurface tear was noted within the supraspinatus. The remaining cuff  was intact. Mild degenerative fraying of the subscapularis was noted.  The patient was prepped and draped in a sterile fashion. A timeout was performed to verify the patient's name, date of birth, medical record number, correct site of surgery and correct procedure to be performed there was also used to verify the patient received antibiotics that all appropriate instruments, implants and radiographs studies were available in the room. Once all in attendance were in agreement case began.  Bony landmarks were drawn out with a surgical marker along with proposed arthroscopy incisions. These were pre-injected with 1% lidocaine plain. An 11 blade was used to establish a posterior portal through which the arthroscope was placed in the glenohumeral joint. A full diagnostic examination of the shoulder was performed. The anterior portal was established under direct visualization with an 18-gauge spinal needle.  A 5.75 mm arthroscopic cannula was placed through the anterior portal.   The intra-articular portion of the biceps tendon was found to have a partial tear involving greater than 50% of the diameter. Therefore the decision was made to perform a tenotomy. An arthroscopic shaver was used to release the biceps tendon off the superior labrum. The arthroscopic shaver was then used to debride the frayed edges of the labrum. There were no anterior or superior labral tears seen. The small anterior area of labral detachment was a normal variant, Buford complex. There were no loose bodies within the inferior recess and no evidence of HAGL lesion.  The arthroscope was then placed in the subacromial space. A lateral portal was then established using an 18-gauge spinal needle for localization.   The greater tuberosity was debrided using a 5.5 mm resector shaver blade to remove all remaining foreign fibers of the rotator cuff.  Debridement was  performed until punctate bleeding was seen at the greater tuberosity footprint,  which will allow for rotator cuff healing.  Extensive bursitis was encountered and debrided using a 4-0 resector shaver blade and a 90 ArthroCare wand from the lateral portal. Using the SpeedBridge system medial anchors with fiber tape were placed. The cuff was mobilized and the tape passed through the rotator cuff. The tape was then crossed in usual fashion and fixated on the lateral side with two SwiveLock anchors. The final construct was stable and moved as a unit with excellent coverage of the humeral head.  All incisions were copiously irrigated. Skin closure for the arthroscopic incisions was performed with 4-0 nylon.  A dry sterile dressing including Steri-Strips was applied . The patient was placed in an abduction sling.  All sharp and instrument counts were correct at the conclusion of the case. I was scrubbed and present for the entire case. I spoke with the patient's family in the post-op consultation room and informed them that the case had been performed without complication and the patient was stable in recovery room.   Kurtis Bushman, MD

## 2016-12-11 NOTE — Anesthesia Preprocedure Evaluation (Addendum)
Anesthesia Evaluation  Patient identified by MRN, date of birth, ID band Patient awake    Reviewed: Allergy & Precautions, H&P , NPO status , Patient's Chart, lab work & pertinent test results  History of Anesthesia Complications Negative for: history of anesthetic complications  Airway Mallampati: III  TM Distance: <3 FB Neck ROM: limited    Dental  (+) Poor Dentition, Chipped, Missing, Partial Upper   Pulmonary shortness of breath and with exertion, pneumonia, COPD, former smoker,           Cardiovascular Exercise Tolerance: Good (-) angina(-) Past MI and (-) DOE negative cardio ROS       Neuro/Psych PSYCHIATRIC DISORDERS Depression  Neuromuscular disease    GI/Hepatic Neg liver ROS, GERD  Medicated and Controlled,  Endo/Other  negative endocrine ROS  Renal/GU      Musculoskeletal  (+) Arthritis , Fibromyalgia -  Abdominal   Peds  Hematology negative hematology ROS (+)   Anesthesia Other Findings Patientt endorses baseline weakness in left shoulder, arm and hand at baseline pre block.  Past Medical History: No date: Anemia No date: Arthritis     Comment:  back No date: Bursitis of both feet No date: Chronic bronchitis (HCC) No date: Chronic cough     Comment:  PT WITH CURRENT PRODUCTIVE COUGH (GREEN/YELLOWISH)               WITHOUT FEVER-PT STATES SHE WILL CALL HER PULMONOLOGIST               ON 12-04-16) No date: Dyspnea     Comment:  CHRONIC-SEES A PULMONOLOGIST AT [employer] No date: GERD (gastroesophageal reflux disease)     Comment:  NO MEDS No date: H/O gastric ulcer No date: Lupus 02/2015: Pneumonia No date: Wears dentures     Comment:  partial upper  Past Surgical History: No date: CESAREAN SECTION     Comment:  x2 2012: CHOLECYSTECTOMY No date: FOOT SURGERY; Bilateral     Comment:  x5 07/05/2016: MYRINGOTOMY WITH TUBE PLACEMENT; Bilateral     Comment:  Procedure: MYRINGOTOMY WITH TUBE  PLACEMENT WITH               BUTTERFLY;  Surgeon: Bud Face, MD;  Location:               [facility];  Service: ENT;  Laterality:               Bilateral;  BUTTERFLY TUBES 08/2015: NECK SURGERY     Comment:  [facility] No date: STOMACH SURGERY  BMI    Body Mass Index:  28.34 kg/m      Reproductive/Obstetrics negative OB ROS                           Anesthesia Physical Anesthesia Plan  ASA: III  Anesthesia Plan: General ETT   Post-op Pain Management: GA combined w/ Regional for post-op pain   Induction: Intravenous  PONV Risk Score and Plan:   Airway Management Planned: Oral ETT  Additional Equipment:   Intra-op Plan:   Post-operative Plan: Extubation in OR  Informed Consent: I have reviewed the patients History and Physical, chart, labs and discussed the procedure including the risks, benefits and alternatives for the proposed anesthesia with the patient or authorized representative who has indicated his/her understanding and acceptance.   Dental Advisory Given  Plan Discussed with: Anesthesiologist, CRNA and Surgeon  Anesthesia Plan Comments: (Patient consented for  risks of anesthesia including but not limited to:  - adverse reactions to medications - damage to teeth, lips or other oral mucosa - sore throat or hoarseness - Damage to heart, brain, lungs or loss of life  Patient voiced understanding.)       Anesthesia Quick Evaluation

## 2016-12-11 NOTE — H&P (Signed)
The patient has been re-examined, and the chart reviewed, and there have been no interval changes to the documented history and physical.  Plan a left shoulder arthroscopic rotator cuff repair today.  Anesthesia will be consulted regarding a peripheral nerve block for post-operative pain.  The risks, benefits, and alternatives have been discussed at length, and the patient is willing to proceed.

## 2016-12-11 NOTE — Anesthesia Procedure Notes (Signed)
Procedure Name: Intubation Performed by: Chanie Soucek Pre-anesthesia Checklist: Patient identified, Patient being monitored, Timeout performed, Emergency Drugs available and Suction available Patient Re-evaluated:Patient Re-evaluated prior to induction Oxygen Delivery Method: Circle system utilized Preoxygenation: Pre-oxygenation with 100% oxygen Induction Type: IV induction Ventilation: Mask ventilation without difficulty Laryngoscope Size: Mac and 3 Grade View: Grade I Tube type: Oral Tube size: 7.0 mm Number of attempts: 1 Airway Equipment and Method: Stylet Placement Confirmation: ETT inserted through vocal cords under direct vision,  positive ETCO2 and breath sounds checked- equal and bilateral Secured at: 21 cm Tube secured with: Tape Dental Injury: Teeth and Oropharynx as per pre-operative assessment        

## 2016-12-11 NOTE — Transfer of Care (Signed)
Immediate Anesthesia Transfer of Care Note  Patient: Brittany Vaughan  Procedure(s) Performed: SHOULDER ARTHROSCOPY WITH OPEN ROTATOR CUFF REPAIR (Left )  Patient Location: PACU  Anesthesia Type:General  Level of Consciousness: awake, alert  and oriented  Airway & Oxygen Therapy: Patient Spontanous Breathing and Patient connected to face mask oxygen  Post-op Assessment: Report given to RN and Post -op Vital signs reviewed and stable  Post vital signs: Reviewed and stable  Last Vitals:  Vitals:   12/11/16 1049 12/11/16 1104  BP: (!) 141/79 128/84  Pulse: 76 73  Resp: 14 17  Temp:    SpO2: 99% 100%    Last Pain:  Vitals:   12/11/16 1104  TempSrc:   PainSc: 0-No pain         Complications: No apparent anesthesia complications

## 2016-12-11 NOTE — Anesthesia Postprocedure Evaluation (Signed)
Anesthesia Post Note  Patient: Brittany Vaughan  Procedure(s) Performed: SHOULDER ARTHROSCOPY WITH OPEN ROTATOR CUFF REPAIR (Left )  Patient location during evaluation: PACU Anesthesia Type: General Level of consciousness: awake and alert Pain management: pain level controlled Vital Signs Assessment: post-procedure vital signs reviewed and stable Respiratory status: spontaneous breathing, nonlabored ventilation, respiratory function stable and patient connected to nasal cannula oxygen Cardiovascular status: blood pressure returned to baseline and stable Postop Assessment: no apparent nausea or vomiting Anesthetic complications: no     Last Vitals:  Vitals:   12/11/16 1444 12/11/16 1500  BP: (!) 131/91 136/77  Pulse: 74 74  Resp: 17 16  Temp: 36.7 C 36.7 C  SpO2: 97% 98%    Last Pain:  Vitals:   12/11/16 1500  TempSrc: Temporal  PainSc: 2                  Cleda Mccreedy Piscitello

## 2016-12-11 NOTE — Discharge Instructions (Signed)
Patient to wear sling at all times. May remove for hygiene purposes. Follow-up as scheduled.  AMBULATORY SURGERY  DISCHARGE INSTRUCTIONS   1) The drugs that you were given will stay in your system until tomorrow so for the next 24 hours you should not:  A) Drive an automobile B) Make any legal decisions C) Drink any alcoholic beverage   2) You may resume regular meals tomorrow.  Today it is better to start with liquids and gradually work up to solid foods.  You may eat anything you prefer, but it is better to start with liquids, then soup and crackers, and gradually work up to solid foods.   3) Please notify your doctor immediately if you have any unusual bleeding, trouble breathing, redness and pain at the surgery site, drainage, fever, or pain not relieved by medication.    4) Additional Instructions: TAKE A STOOL SOFTENER TWICE A DAY WHILE TAKING NARCOTIC PAIN MEDICINE TO PREVENT CONSTIPATION   Please contact your physician with any problems or Same Day Surgery at [phone], Monday through Friday 6 am to 4 pm, or [place] at [facility] number at [phone].

## 2016-12-11 NOTE — Anesthesia Post-op Follow-up Note (Signed)
Anesthesia QCDR form completed.        

## 2016-12-12 ENCOUNTER — Encounter: Payer: Self-pay | Admitting: Orthopedic Surgery

## 2017-01-03 NOTE — Addendum Note (Signed)
Addendum  created 01/03/17 1002 by Stormy Fabianurtis, [place], CRNA   Charge Capture section accepted

## 2018-04-27 IMAGING — CR DG CHEST 2V
2 series · 2 of 2 positions shown · non-contrast
Comparison: 09/29/2014

CLINICAL DATA: Productive cough and fever for 3 weeks with
shortness of Breath

EXAM:
CHEST  2 VIEW

[chest pa]
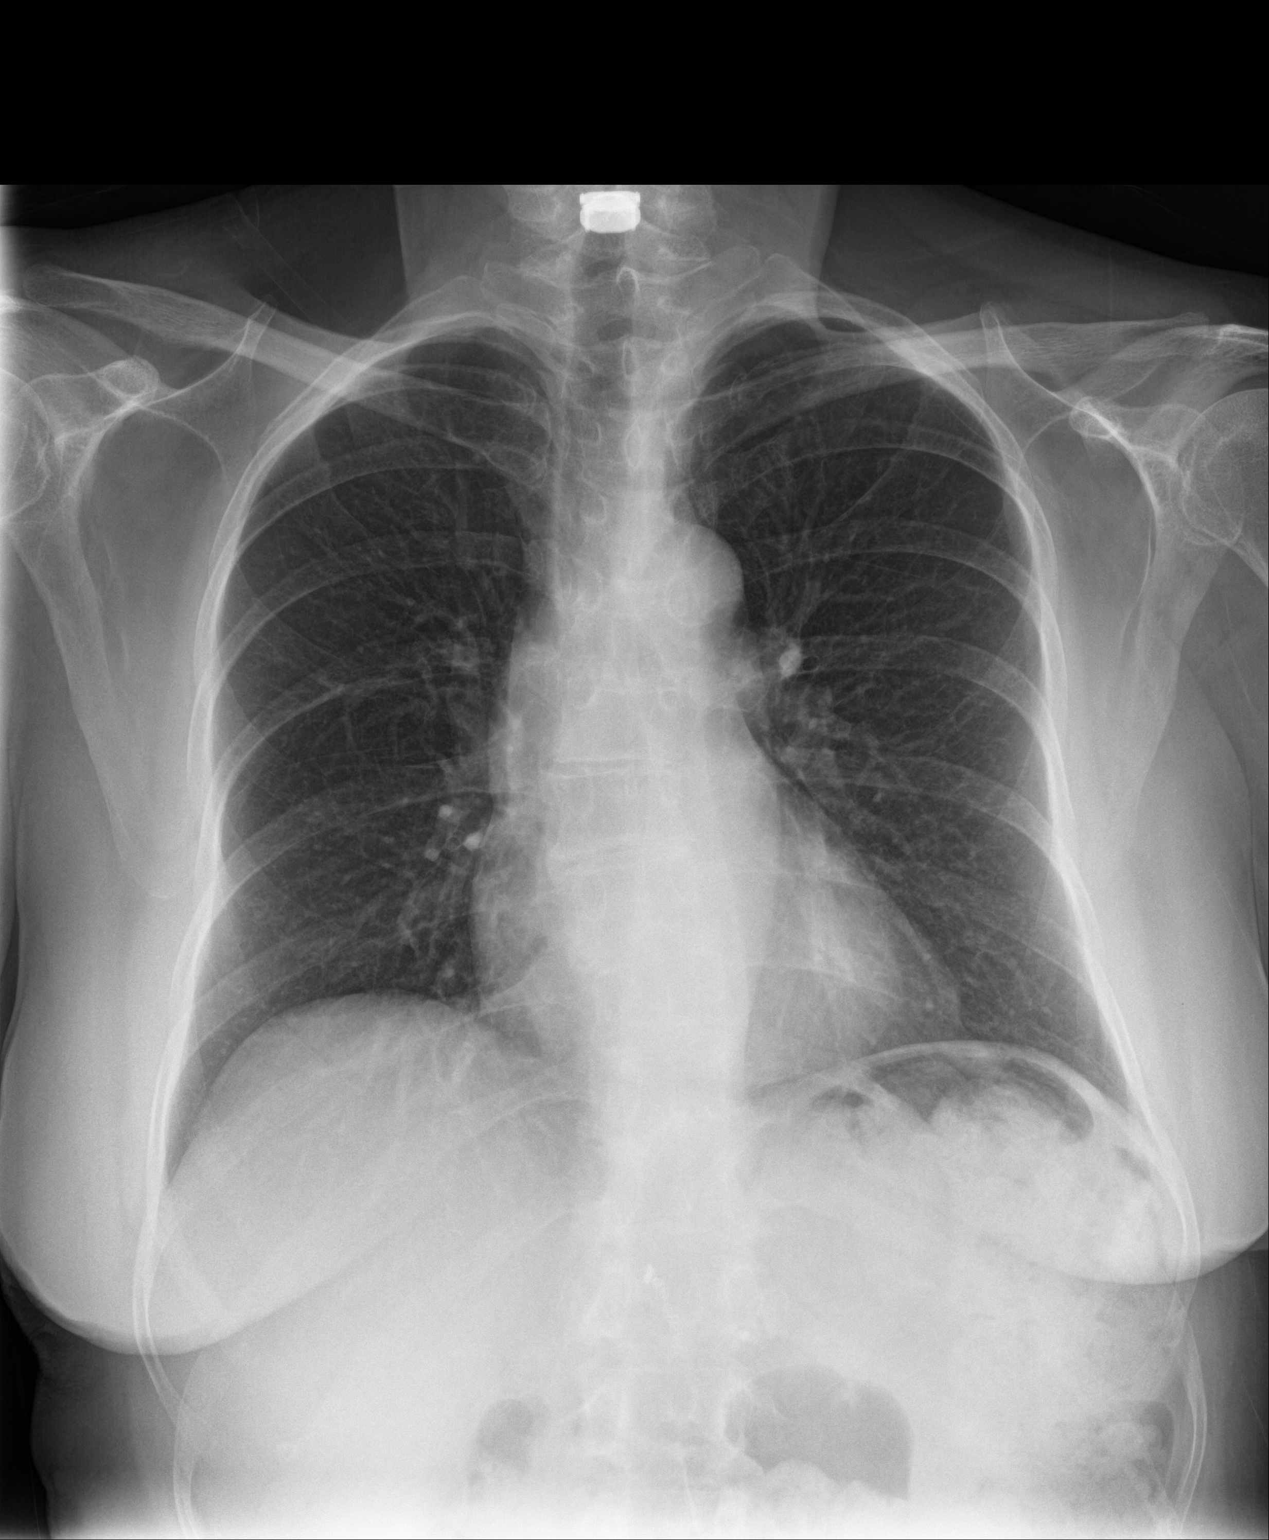

[chest lat]
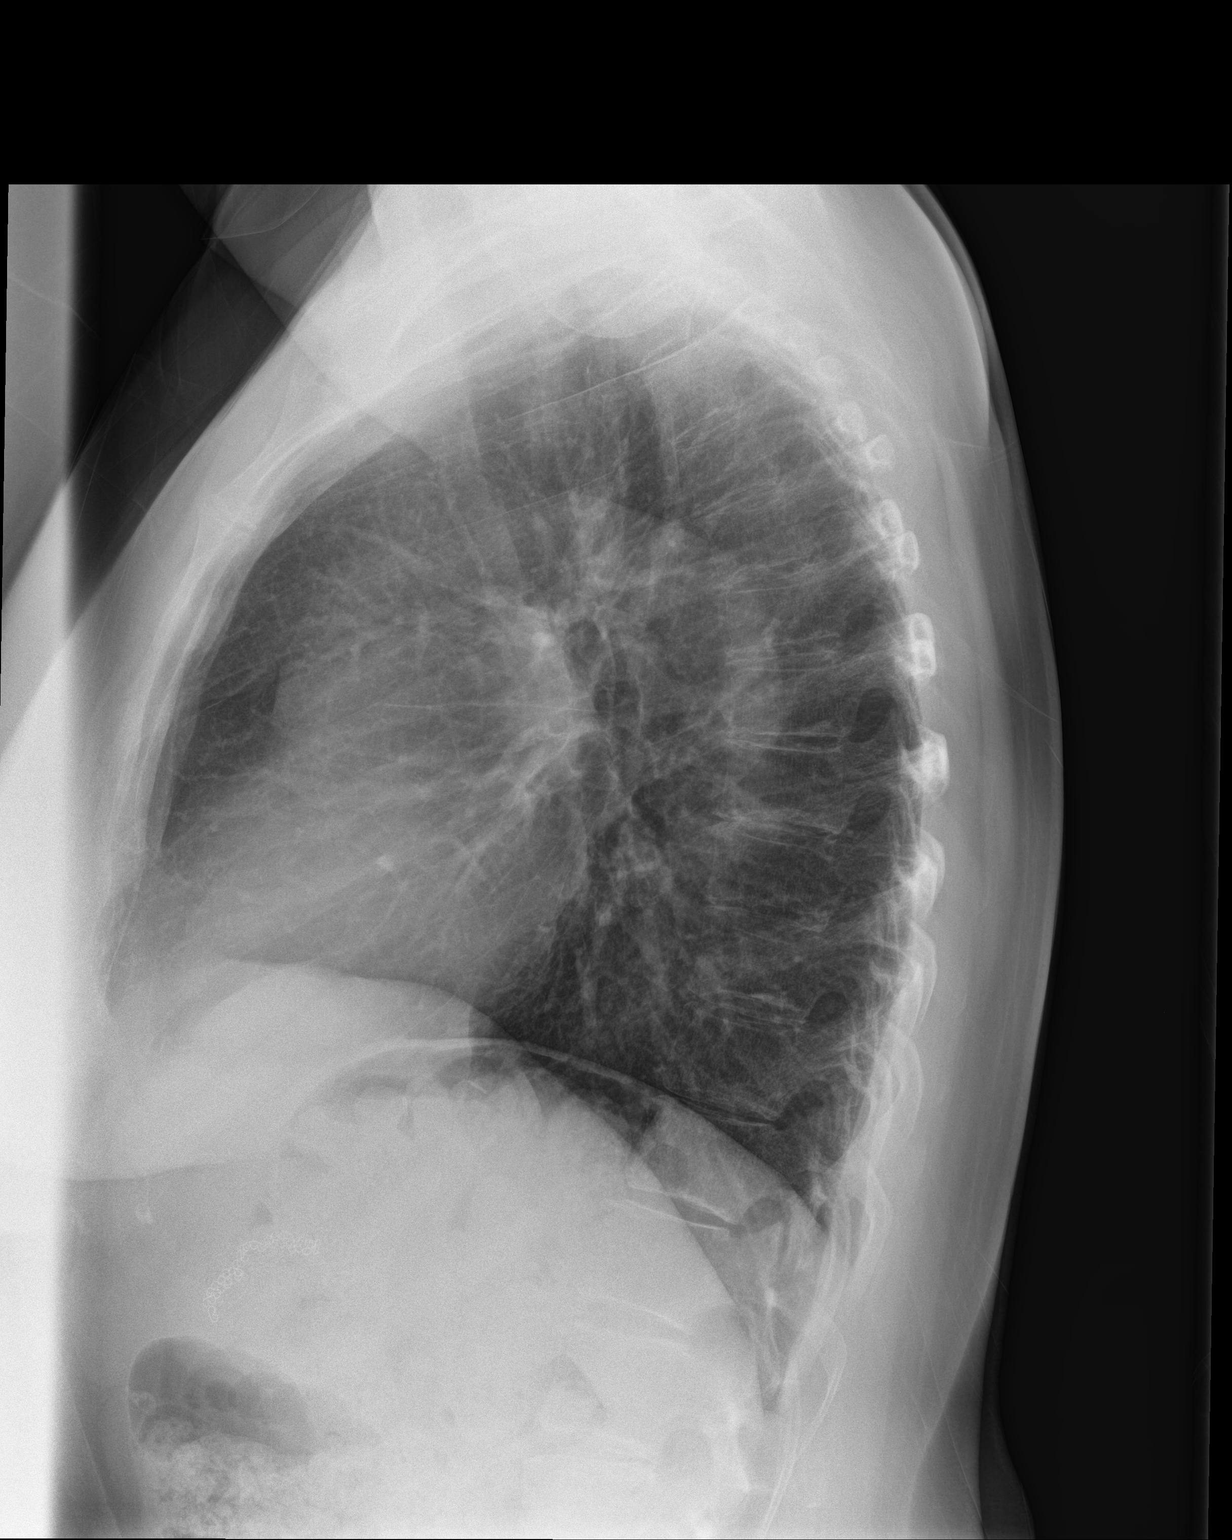

[2 of 2 positions shown; findings below may reference images not displayed]

FINDINGS: Cardiac shadow is within normal limits. The lungs are well aerated
bilaterally. No focal infiltrate or sizable effusion is seen. The
bony structures are within normal limits. Postsurgical changes in
the cervical spine are seen.
IMPRESSION: No active cardiopulmonary disease.

## 2019-08-13 ENCOUNTER — Ambulatory Visit: Payer: Commercial Managed Care - HMO | Admitting: Podiatry

## 2022-03-02 ENCOUNTER — Other Ambulatory Visit: Payer: 59

## 2022-03-28 ENCOUNTER — Other Ambulatory Visit: Payer: Self-pay | Admitting: Otolaryngology

## 2022-03-28 ENCOUNTER — Encounter: Payer: Self-pay | Admitting: Otolaryngology

## 2022-03-28 DIAGNOSIS — R1310 Dysphagia, unspecified: Secondary | ICD-10-CM

## 2022-03-30 ENCOUNTER — Other Ambulatory Visit: Payer: 59

## 2022-03-31 ENCOUNTER — Other Ambulatory Visit: Payer: 59

## 2022-04-04 ENCOUNTER — Ambulatory Visit
Admission: RE | Admit: 2022-04-04 | Discharge: 2022-04-04 | Disposition: A | Discharge status: Home / Self Care | Payer: Medicare Other | Source: Ambulatory Visit | Attending: Otolaryngology | Admitting: Otolaryngology

## 2022-04-04 DIAGNOSIS — R1310 Dysphagia, unspecified: Secondary | ICD-10-CM

## 2022-05-11 ENCOUNTER — Other Ambulatory Visit: Payer: Self-pay | Admitting: Otolaryngology

## 2022-05-11 DIAGNOSIS — R42 Dizziness and giddiness: Secondary | ICD-10-CM

## 2022-05-30 ENCOUNTER — Ambulatory Visit
Admission: RE | Admit: 2022-05-30 | Discharge: 2022-05-30 | Disposition: A | Discharge status: Home / Self Care | Payer: Medicare Other | Source: Ambulatory Visit | Attending: Otolaryngology | Admitting: Otolaryngology

## 2022-05-30 DIAGNOSIS — R42 Dizziness and giddiness: Secondary | ICD-10-CM

## 2022-05-30 MED ORDER — GADOPICLENOL 0.5 MMOL/ML IV SOLN
7.5000 mL | Freq: Once | INTRAVENOUS | Status: AC | PRN
  Administered 2022-05-30: 7.5 mL via INTRAVENOUS

## 2022-07-12 ENCOUNTER — Other Ambulatory Visit: Payer: Self-pay

## 2022-07-13 ENCOUNTER — Encounter: Payer: Self-pay | Admitting: Gastroenterology

## 2022-07-13 ENCOUNTER — Other Ambulatory Visit: Payer: Self-pay

## 2022-07-13 ENCOUNTER — Ambulatory Visit: Payer: Medicare Other | Admitting: Gastroenterology

## 2022-07-13 VITALS — BP 127/83 | HR 78 | Temp 98.7°F | Ht 61.0 in | Wt 148.0 lb

## 2022-07-13 DIAGNOSIS — R1319 Other dysphagia: Secondary | ICD-10-CM | POA: Diagnosis not present

## 2022-07-13 NOTE — Addendum Note (Signed)
Addended by: Adela Ports on: 07/13/2022 03:29 PM   Modules accepted: Orders

## 2022-07-13 NOTE — Progress Notes (Signed)
Wyline Mood MD, MRCP(U.K) [address]  [address]  [place], [place] [address]  Main: [phone]  Fax: [phone]   Gastroenterology Consultation  Referring Provider:     Dortha Kern, MD Primary Care Physician:  Dortha Kern, MD Primary Gastroenterologist:  Dr. Wyline Mood  Reason for Consultation:     Dysphagia        HPI:   Brittany Vaughan is a 67 y.o. y/o female referred for consultation & management  by Dr. Quillian Quince, Doreene Nest, MD.     She has been referred for dysphagia says that he has had difficulty swallowing for years but has caught was recently.  Presently she can still eat food in fact she had a cheese sandwich for lunch not really lost weight.  She had issues with the hearing and has had tubes placed recently.  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  Past Medical History:  Diagnosis Date   Anemia    Arthritis    back   Bursitis of both feet    Chronic bronchitis (HCC)    Chronic cough    PT WITH CURRENT PRODUCTIVE COUGH (GREEN/YELLOWISH) WITHOUT FEVER-PT STATES SHE WILL CALL HER PULMONOLOGIST ON 12-04-16)   Dyspnea    CHRONIC-SEES A PULMONOLOGIST AT [facility]   GERD (gastroesophageal reflux disease)    NO MEDS   H/O gastric ulcer    Lupus (HCC)    Pneumonia 02/2015   Wears dentures    partial upper    Past Surgical History:  Procedure Laterality Date   CESAREAN SECTION     x2   CHOLECYSTECTOMY  2012   FOOT SURGERY Bilateral    x5   MYRINGOTOMY WITH TUBE PLACEMENT Bilateral 07/05/2016   Procedure: MYRINGOTOMY WITH TUBE PLACEMENT WITH BUTTERFLY;  Surgeon: Bud Face, MD;  Location: [facility];  Service: ENT;  Laterality: Bilateral;  BUTTERFLY TUBES   NECK SURGERY  08/2015   [facility]   SHOULDER ARTHROSCOPY WITH OPEN ROTATOR CUFF REPAIR Left 12/11/2016   Procedure: SHOULDER ARTHROSCOPY WITH OPEN ROTATOR CUFF REPAIR;  Surgeon: Lyndle Herrlich, MD;  Location: [facility] ORS;  Service: Orthopedics;  Laterality: Left;   STOMACH SURGERY      Prior to Admission medications   Medication Sig Start Date End Date Taking? Authorizing Provider  albuterol (PROAIR HFA) 108 (90 Base) MCG/ACT inhaler Inhale 2 puffs into the lungs every 6 (six) hours as needed for wheezing or shortness of breath.     [provider]  azelastine (ASTELIN) 0.1 % nasal spray Place 2 sprays into both nostrils 2 (two) times daily. 07/17/12   [provider]  cetirizine (ZYRTEC) 10 MG tablet Take 10 mg by mouth daily as needed for allergies.    [provider]  cyclobenzaprine (FLEXERIL) 10 MG tablet Take 10 mg by mouth at bedtime.    [provider]  ferrous gluconate (FERGON) 324 MG tablet TAKE 1 TABLET BY MOUTH DAILY 11/27/16   [provider]  folic acid (FOLVITE) 1 MG tablet Take 1 mg by mouth daily. 03/29/12   [provider]  furosemide (  LASIX) 20 MG tablet Take 20-40 mg by mouth daily as needed. 05/10/22   [provider]  HYDROcodone-acetaminophen (NORCO/VICODIN) 5-325 MG tablet Take 1 tablet by mouth every 6 (six) hours as needed. 04/13/22   [provider]  hydroxychloroquine (PLAQUENIL) 200 MG tablet Take 200 mg by mouth daily. 03/29/12   [provider]  ibuprofen (ADVIL,MOTRIN) 200 MG tablet Take 400 mg by mouth every 6 (six) hours as needed for mild pain.    [provider]  ketorolac (ACULAR) 0.5 % ophthalmic solution Place 1 drop into both eyes 4 (four) times daily. 07/22/12   [provider]  methotrexate (RHEUMATREX) 2.5 MG tablet Take 2.5 mg by mouth once a week.    [provider]   nystatin-triamcinolone ointment (MYCOLOG) Apply 1 Application topically as needed. 06/30/10   [provider]  omeprazole (PRILOSEC) 40 MG capsule Take 40 mg by mouth daily. 07/17/12   [provider]  senna (SENOKOT) 8.6 MG TABS tablet Take 1 tablet (8.6 mg total) by mouth daily. 12/11/16   Lyndle Herrlich, MD  tetrahydrozoline 0.05 % ophthalmic solution Place 1 drop into both eyes daily as needed (DRY EYES).    [provider]    No family history on file.   Social History   Tobacco Use   Smoking status: Former    Packs/day: 0.50    Years: 2.00    Additional pack years: 0.00    Total pack years: 1.00    Types: Cigarettes    Quit date: 12/05/1986    Years since quitting: 35.6   Smokeless tobacco: Never   Tobacco comments:    smoked for about 2 years, over 40 yrs ago  Vaping Use   Vaping Use: Never used  Substance Use Topics   Alcohol use: No    Alcohol/week: 0.0 standard drinks of alcohol   Drug use: No    Allergies as of 07/13/2022 - Review Complete 07/13/2022  Allergen Reaction Noted   Sulfa antibiotics Hives 08/11/2015   Cyclobenzaprine Itching 06/29/2016    Review of Systems:    All systems reviewed and negative except where noted in HPI.   Physical Exam:  BP (!) 156/82   Pulse 74   Temp 98.7 F (37.1 C) (Oral)   Ht 5\' 1"  (1.549 m)   Wt 148 lb (67.1 kg)   BMI 27.96 kg/m  No LMP recorded. Patient is postmenopausal. Psych:  Alert and cooperative. Normal mood and affect. General:   Alert,  Well-developed, well-nourished, pleasant and cooperative in NAD Head:  Normocephalic and atraumatic. Eyes:  Sclera clear, no icterus.   Conjunctiva pink. Ears:  Normal auditory acuity. Neck:  Supple; no masses or thyromegaly. Lungs:  Respirations even and unlabored.  Clear throughout to auscultation.   No wheezes, crackles, or rhonchi. No acute distress. Heart:  Regular rate and rhythm; no murmurs, clicks, rubs, or gallops. Abdomen:  Normal bowel  sounds.  No bruits.  Soft, non-tender and non-distended without masses, hepatosplenomegaly or hernias noted.  No guarding or rebound tenderness.    Neurologic:  Alert and oriented x3;  grossly normal neurologically. Psych:  Alert and cooperative. Normal mood and affect.  Imaging Studies: No results found.  Assessment and Plan:   Brittany Vaughan is a 67 y.o. 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  Concerns for cricopharyngeal bar.  I will plan to  perform endoscopy and dilate the esophagus and then decide how her swallowing is doing based on which we can discuss about any further treatment of the Cricopharyngeal bar including myotomy which will need to be performed by an ENT surgeon.  Presently she is able to eat a sandwich which suggests that the lumen may be much wider than seen on the barium  Plan 1.  CBC to check for anemia 2.  EGD with possible dilation   I have discussed alternative options, risks & benefits,  which include, but are not limited to, bleeding, infection, perforation,respiratory complication & drug reaction.  The patient agrees with this plan & written consent will be obtained.     Follow up in 8 weeks   Dr Wyline Mood MD,MRCP(U.K)

## 2022-07-14 LAB — CBC WITH DIFFERENTIAL/PLATELET
Basophils Absolute: 0 10*3/uL (ref 0.0–0.2)
Basos: 1 %
EOS (ABSOLUTE): 0.1 10*3/uL (ref 0.0–0.4)
Eos: 2 %
Hematocrit: 36.6 % (ref 34.0–46.6)
Hemoglobin: 11.6 g/dL (ref 11.1–15.9)
Immature Grans (Abs): 0 10*3/uL (ref 0.0–0.1)
Immature Granulocytes: 0 %
Lymphocytes Absolute: 1.3 10*3/uL (ref 0.7–3.1)
Lymphs: 20 %
MCH: 26.7 pg (ref 26.6–33.0)
MCHC: 31.7 g/dL (ref 31.5–35.7)
MCV: 84 fL (ref 79–97)
Monocytes Absolute: 0.7 10*3/uL (ref 0.1–0.9)
Monocytes: 10 %
Neutrophils Absolute: 4.5 10*3/uL (ref 1.4–7.0)
Neutrophils: 67 %
Platelets: 322 10*3/uL (ref 150–450)
RBC: 4.34 x10E6/uL (ref 3.77–5.28)
RDW: 13.5 % (ref 11.7–15.4)
WBC: 6.7 10*3/uL (ref 3.4–10.8)

## 2022-07-18 ENCOUNTER — Ambulatory Visit
Admission: RE | Admit: 2022-07-18 | Discharge: 2022-07-18 | Disposition: A | Discharge status: Home / Self Care | Payer: Medicare Other | Source: Ambulatory Visit | Attending: Family Medicine | Admitting: Family Medicine

## 2022-07-18 ENCOUNTER — Other Ambulatory Visit: Payer: Self-pay | Admitting: Family Medicine

## 2022-07-18 ENCOUNTER — Ambulatory Visit
Admission: RE | Admit: 2022-07-18 | Discharge: 2022-07-18 | Disposition: A | Discharge status: Home / Self Care | Payer: Medicare Other | Attending: Family Medicine | Admitting: Family Medicine

## 2022-07-18 DIAGNOSIS — M47816 Spondylosis without myelopathy or radiculopathy, lumbar region: Secondary | ICD-10-CM

## 2022-08-07 ENCOUNTER — Encounter: Payer: Self-pay | Admitting: Gastroenterology

## 2022-08-08 ENCOUNTER — Encounter
Admission: RE | Disposition: A | Discharge status: Home / Self Care | Payer: Self-pay | Source: Home / Self Care | Attending: Gastroenterology

## 2022-08-08 ENCOUNTER — Ambulatory Visit: Payer: Medicare Other | Admitting: Registered Nurse

## 2022-08-08 ENCOUNTER — Ambulatory Visit
Admission: RE | Admit: 2022-08-08 | Discharge: 2022-08-08 | Disposition: A | Discharge status: Home / Self Care | Payer: Medicare Other | Attending: Gastroenterology | Admitting: Gastroenterology

## 2022-08-08 ENCOUNTER — Encounter: Payer: Self-pay | Admitting: Gastroenterology

## 2022-08-08 DIAGNOSIS — K3189 Other diseases of stomach and duodenum: Secondary | ICD-10-CM | POA: Insufficient documentation

## 2022-08-08 DIAGNOSIS — Q394 Esophageal web: Secondary | ICD-10-CM | POA: Insufficient documentation

## 2022-08-08 DIAGNOSIS — K222 Esophageal obstruction: Secondary | ICD-10-CM | POA: Diagnosis not present

## 2022-08-08 DIAGNOSIS — R1319 Other dysphagia: Secondary | ICD-10-CM

## 2022-08-08 DIAGNOSIS — Z87891 Personal history of nicotine dependence: Secondary | ICD-10-CM | POA: Insufficient documentation

## 2022-08-08 DIAGNOSIS — Z9049 Acquired absence of other specified parts of digestive tract: Secondary | ICD-10-CM | POA: Diagnosis not present

## 2022-08-08 DIAGNOSIS — K219 Gastro-esophageal reflux disease without esophagitis: Secondary | ICD-10-CM | POA: Diagnosis not present

## 2022-08-08 DIAGNOSIS — R1310 Dysphagia, unspecified: Secondary | ICD-10-CM | POA: Diagnosis present

## 2022-08-08 DIAGNOSIS — M329 Systemic lupus erythematosus, unspecified: Secondary | ICD-10-CM | POA: Diagnosis not present

## 2022-08-08 DIAGNOSIS — Z09 Encounter for follow-up examination after completed treatment for conditions other than malignant neoplasm: Secondary | ICD-10-CM | POA: Insufficient documentation

## 2022-08-08 HISTORY — PX: ESOPHAGOGASTRODUODENOSCOPY (EGD) WITH PROPOFOL: SHX5813

## 2022-08-08 SURGERY — ESOPHAGOGASTRODUODENOSCOPY (EGD) WITH PROPOFOL
Anesthesia: General

## 2022-08-08 MED ORDER — PROPOFOL 1000 MG/100ML IV EMUL
INTRAVENOUS | Status: AC
  Filled 2022-08-08: qty 100

## 2022-08-08 MED ORDER — GLYCOPYRROLATE 0.2 MG/ML IJ SOLN
INTRAMUSCULAR | Status: AC
  Filled 2022-08-08: qty 1

## 2022-08-08 MED ORDER — PROPOFOL 10 MG/ML IV BOLUS
INTRAVENOUS | Status: AC
  Filled 2022-08-08: qty 20

## 2022-08-08 MED ORDER — PROPOFOL 500 MG/50ML IV EMUL
INTRAVENOUS | Status: DC | PRN
  Administered 2022-08-08: 150 ug/kg/min via INTRAVENOUS

## 2022-08-08 MED ORDER — SODIUM CHLORIDE 0.9 % IV SOLN: INTRAVENOUS | Status: DC

## 2022-08-08 MED ORDER — ONDANSETRON HCL 4 MG/2ML IJ SOLN
INTRAMUSCULAR | Status: DC | PRN
  Administered 2022-08-08: 4 mg via INTRAVENOUS

## 2022-08-08 MED ORDER — PROPOFOL 10 MG/ML IV BOLUS
INTRAVENOUS | Status: DC | PRN
  Administered 2022-08-08: 50 mg via INTRAVENOUS

## 2022-08-08 MED ORDER — LIDOCAINE HCL (CARDIAC) PF 100 MG/5ML IV SOSY
PREFILLED_SYRINGE | INTRAVENOUS | Status: DC | PRN
  Administered 2022-08-08: 40 mg via INTRAVENOUS

## 2022-08-08 MED ORDER — ONDANSETRON HCL 4 MG/2ML IJ SOLN
INTRAMUSCULAR | Status: AC
  Filled 2022-08-08: qty 2

## 2022-08-08 NOTE — Anesthesia Postprocedure Evaluation (Signed)
Anesthesia Post Note  Patient: Brittany Vaughan  Procedure(s) Performed: ESOPHAGOGASTRODUODENOSCOPY (EGD) WITH PROPOFOL  Patient location during evaluation: Endoscopy Anesthesia Type: General Level of consciousness: awake and alert Pain management: pain level controlled Vital Signs Assessment: post-procedure vital signs reviewed and stable Respiratory status: spontaneous breathing, nonlabored ventilation, respiratory function stable and patient connected to nasal cannula oxygen Cardiovascular status: blood pressure returned to baseline and stable Postop Assessment: no apparent nausea or vomiting Anesthetic complications: no  No notable events documented.   Last Vitals:  Vitals:   08/08/22 0917 08/08/22 0927  BP: (!) 144/81 (!) 148/90  Pulse:    Temp:    SpO2:      Last Pain:  Vitals:   08/08/22 0927  TempSrc:   PainSc: 0-No pain                 Stephanie Coup

## 2022-08-08 NOTE — Anesthesia Preprocedure Evaluation (Addendum)
Anesthesia Evaluation  Patient identified by MRN, date of birth, ID band Patient awake    Reviewed: Allergy & Precautions, H&P , NPO status , Patient's Chart, lab work & pertinent test results  Airway Mallampati: II  TM Distance: >3 FB Neck ROM: full    Dental  (+) Lower Dentures, Upper Dentures   Pulmonary former smoker Occasional sob and need for albuterol. Denies asthma/COPD   Pulmonary exam normal        Cardiovascular Exercise Tolerance: Good (-) angina Normal cardiovascular exam     Neuro/Psych  PSYCHIATRIC DISORDERS  Depression     Neuromuscular disease    GI/Hepatic Neg liver ROS,GERD  ,,esophageal dysphagia   Endo/Other  negative endocrine ROS    Renal/GU negative Renal ROS  negative genitourinary   Musculoskeletal  (+) Arthritis ,    Abdominal Normal abdominal exam  (+)   Peds  Hematology negative hematology ROS (+)   Anesthesia Other Findings Past Medical History: No date: Anemia No date: Arthritis     Comment:  back No date: Bursitis of both feet No date: Chronic bronchitis (HCC) No date: Chronic cough No date: Dyspnea     Comment:  CHRONIC-SEES A PULMONOLOGIST AT [facility] No date: GERD (gastroesophageal reflux disease)     Comment:  NO MEDS No date: H/O gastric ulcer No date: Lupus (HCC) No date: Wears dentures     Comment:  partial upper  Past Surgical History: No date: CESAREAN SECTION     Comment:  x2 2012: CHOLECYSTECTOMY No date: FOOT SURGERY; Bilateral     Comment:  x5 07/05/2016: MYRINGOTOMY WITH TUBE PLACEMENT; Bilateral     Comment:  Procedure: MYRINGOTOMY WITH TUBE PLACEMENT WITH               BUTTERFLY;  Surgeon: Bud Face, MD;  Location:               [facility];  Service: ENT;  Laterality:               Bilateral;  BUTTERFLY TUBES 08/2015: NECK SURGERY     Comment:  [facility] 12/11/2016: SHOULDER ARTHROSCOPY WITH OPEN ROTATOR CUFF REPAIR; Left      Comment:  Procedure: SHOULDER ARTHROSCOPY WITH OPEN ROTATOR CUFF               REPAIR;  Surgeon: Lyndle Herrlich, MD;  Location: [facility]               ORS;  Service: Orthopedics;  Laterality: Left; No date: STOMACH SURGERY  BMI    Body Mass Index: 27.89 kg/m      Reproductive/Obstetrics negative OB ROS                             Anesthesia Physical Anesthesia Plan  ASA: 2  Anesthesia Plan: General   Post-op Pain Management:    Induction: Intravenous  PONV Risk Score and Plan: Propofol infusion and TIVA  Airway Management Planned: Natural Airway  Additional Equipment:   Intra-op Plan:   Post-operative Plan:   Informed Consent: I have reviewed the patients History and Physical, chart, labs and discussed the procedure including the risks, benefits and alternatives for the proposed anesthesia with the patient or authorized representative who has indicated his/her understanding and acceptance.     Dental Advisory Given  Plan Discussed with: CRNA and Surgeon  Anesthesia Plan Comments: (Risk of denture damage discussed)  Anesthesia Quick Evaluation

## 2022-08-08 NOTE — Transfer of Care (Signed)
Immediate Anesthesia Transfer of Care Note  Patient: Brittany Vaughan  Procedure(s) Performed: ESOPHAGOGASTRODUODENOSCOPY (EGD) WITH PROPOFOL  Patient Location: PACU  Anesthesia Type:General  Level of Consciousness: drowsy  Airway & Oxygen Therapy: Patient Spontanous Breathing  Post-op Assessment: Report given to RN and Post -op Vital signs reviewed and stable  Post vital signs: stable  Last Vitals:  Vitals Value Taken Time  BP 126/72 08/08/22 0907  Temp 36.3 C 08/08/22 0907  Pulse 77 08/08/22 0907  Resp 14 08/08/22 0907  SpO2 95 % 08/08/22 0907  Vitals shown include unvalidated device data.  Last Pain:  Vitals:   08/08/22 0907  TempSrc: Temporal  PainSc: Asleep         Complications: No notable events documented.

## 2022-08-08 NOTE — Op Note (Signed)
[facility] Gastroenterology Patient Name: Brittany Vaughan Procedure Date: 08/08/2022 8:36 AM MRN: [id number] Account #: [id number] Date of Birth: 05-06-1955 Admit Type: Outpatient Age: 67 Room: [facility] ENDO ROOM 2 Gender: Female Note Status: Finalized Instrument Name: Upper Endoscope [id number] Procedure:             Upper GI endoscopy Indications:           Dysphagia Providers:             Wyline Mood MD, MD Referring MD:          Wyline Mood MD, MD (Referring MD), Dortha Kern                         (Referring MD) Medicines:             Monitored Anesthesia Care Complications:         No immediate complications. Procedure:             Pre-Anesthesia Assessment:                        - Prior to the procedure, a History and Physical was                         performed, and patient medications, allergies and                         sensitivities were reviewed. The patient's tolerance                         of previous anesthesia was reviewed.                        - The risks and benefits of the procedure and the                         sedation options and risks were discussed with the                         patient. All questions were answered and informed                         consent was obtained.                        - ASA Grade Assessment: II - A patient with mild                         systemic disease.                        After obtaining informed consent, the endoscope was                         passed under direct vision. Throughout the procedure,                         the patient's blood pressure, pulse, and oxygen                         saturations were monitored  continuously. The Endoscope                         was introduced through the mouth, and advanced to the                         third part of duodenum. The upper GI endoscopy was                         accomplished with ease. The patient tolerated the                          procedure well. Findings:      The examined duodenum was normal.      Diffuse severely erythematous mucosa with bleeding on contact was found       on the greater curvature of the stomach. Biopsies were taken with a cold       forceps for histology.      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 Web broke with       passage of scope      One benign-appearing, intrinsic moderate (circumferential scarring or       stenosis; an endoscope may pass) stenosis was found 25 to 28 cm from the       incisors. This stenosis measured 1.1 cm (inner diameter) x 1 cm (in       length). The stenosis was traversed. A TTS dilator was passed through       the scope. Dilation with a 12-08-10 mm balloon dilator was performed to       12 mm. The dilation site was examined and showed moderate mucosal       disruption. This was biopsied with a cold forceps for histology.      The cardia and gastric fundus were normal on retroflexion. Impression:            - Normal examined duodenum.                        - Erythematous mucosa in the greater curvature.                         Biopsied.                        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                        - Benign-appearing esophageal stenosis. Dilated.                         Biopsied. Recommendation:        - Discharge patient to home (with escort).                        - Resume previous diet.                        - Use Prilosec (omeprazole) 40 mg PO daily for 3                         months.                        -  Repeat upper endoscopy in 4 months to evaluate the                         response to therapy.                        - Return to GI office as previously scheduled. 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 All rights reserved. The codes documented in this report are preliminary and upon coder review may  be revised to meet current compliance requirements. Wyline Mood, MD Wyline Mood MD, MD 08/08/2022 9:05:58 AM This report has been signed electronically. Number of Addenda: 0 Note Initiated On: 08/08/2022 8:36 AM Estimated Blood Loss:  Estimated blood loss: none.      [facility]

## 2022-08-08 NOTE — H&P (Signed)
Wyline Mood, MD [address], [address], [place], [place], [address] [address], [place], [place], [address] Phone: [phone]  Fax: [phone]  Primary Care Physician:  Dortha Kern, MD   Pre-Procedure History & Physical: HPI:  Brittany Vaughan is a 67 y.o. female is here for an endoscopy    Past Medical History:  Diagnosis Date   Anemia    Arthritis    back   Bursitis of both feet    Chronic bronchitis (HCC)    Chronic cough    PT WITH CURRENT PRODUCTIVE COUGH (GREEN/YELLOWISH) WITHOUT FEVER-PT STATES SHE WILL CALL HER PULMONOLOGIST ON 12-04-16)   Dyspnea    CHRONIC-SEES A PULMONOLOGIST AT [facility]   GERD (gastroesophageal reflux disease)    NO MEDS   H/O gastric ulcer    Lupus (HCC)    Pneumonia 02/2015   Wears dentures    partial upper    Past Surgical History:  Procedure Laterality Date   CESAREAN SECTION     x2   CHOLECYSTECTOMY  2012   FOOT SURGERY Bilateral    x5   MYRINGOTOMY WITH TUBE PLACEMENT Bilateral 07/05/2016   Procedure: MYRINGOTOMY WITH TUBE PLACEMENT WITH BUTTERFLY;  Surgeon: Bud Face, MD;  Location[facility];  Service: ENT;  Laterality: Bilateral;  BUTTERFLY TUBES   NECK SURGERY  08/2015   [facility]   SHOULDER ARTHROSCOPY WITH OPEN ROTATOR CUFF REPAIR Left 12/11/2016   Procedure: SHOULDER ARTHROSCOPY WITH OPEN ROTATOR CUFF REPAIR;  Surgeon: Lyndle Herrlich, MD;  Location: [facility] ORS;  Service: Orthopedics;  Laterality: Left;   STOMACH SURGERY      Prior to Admission medications   Medication Sig Start Date End Date Taking? Authorizing Provider  albuterol (PROAIR HFA) 108 (90 Base) MCG/ACT inhaler Inhale 2 puffs into the lungs every 6 (six) hours as needed for wheezing or shortness of breath.    Yes [provider]  azelastine (ASTELIN) 0.1 % nasal spray Place 2 sprays into both nostrils 2 (two) times daily. 07/17/12   [provider]  cetirizine (ZYRTEC) 10 MG tablet Take 10 mg by mouth  daily as needed for allergies.    [provider]  cyclobenzaprine (FLEXERIL) 10 MG tablet Take 10 mg by mouth at bedtime.    [provider]  ferrous gluconate (FERGON) 324 MG tablet TAKE 1 TABLET BY MOUTH DAILY 11/27/16   [provider]  folic acid (FOLVITE) 1 MG tablet Take 1 mg by mouth daily. 03/29/12   [provider]  furosemide (LASIX) 20 MG tablet Take 20-40 mg by mouth daily as needed. 05/10/22   [provider]  HYDROcodone-acetaminophen (NORCO/VICODIN) 5-325 MG tablet Take 1 tablet by mouth every 6 (six) hours as needed. 04/13/22   [provider]  hydroxychloroquine (PLAQUENIL) 200 MG tablet Take 200 mg by mouth daily. 03/29/12   [provider]  ibuprofen (ADVIL,MOTRIN) 200 MG tablet Take 400 mg by mouth every 6 (six) hours as needed for mild pain.    [provider]  ketorolac (ACULAR) 0.5 % ophthalmic solution Place 1 drop into both eyes 4 (four) times daily. 07/22/12   [provider]  methotrexate (RHEUMATREX) 2.5 MG tablet Take 2.5 mg by mouth once a week.    [provider]  nystatin-triamcinolone ointment (MYCOLOG) Apply 1 Application topically as needed. 06/30/10   [provider]  omeprazole (PRILOSEC) 40 MG capsule Take 40 mg by mouth daily. 07/17/12   [provider]  senna (SENOKOT) 8.6 MG TABS tablet Take 1 tablet (8.6 mg total) by mouth daily. 12/11/16   Lyndle Herrlich, MD  tetrahydrozoline 0.05 % ophthalmic solution Place 1 drop into both eyes daily as needed (DRY EYES).    [provider]    Allergies as of 07/14/2022 - Review Complete 07/13/2022  Allergen Reaction Noted   Sulfa antibiotics Hives 08/11/2015   Cyclobenzaprine Itching 06/29/2016    History reviewed. No pertinent family history.  Social History   Socioeconomic History   Marital status: Married    Spouse name: Not on file   Number of children: Not on file   Years of education: Not on  file   Highest education level: Not on file  Occupational History   Not on file  Tobacco Use   Smoking status: Former    Packs/day: 0.50    Years: 2.00    Additional pack years: 0.00    Total pack years: 1.00    Types: Cigarettes    Quit date: 12/05/1986    Years since quitting: 35.6   Smokeless tobacco: Never   Tobacco comments:    smoked for about 2 years, over 40 yrs ago  Vaping Use   Vaping Use: Never used  Substance and Sexual Activity   Alcohol use: No    Alcohol/week: 0.0 standard drinks of alcohol   Drug use: No   Sexual activity: Not on file  Other Topics Concern   Not on file  Social History Narrative   Not on file   Social Determinants of Health   Financial Resource Strain: Not on file  Food Insecurity: Not on file  Transportation Needs: Not on file  Physical Activity: Not on file  Stress: Not on file  Social Connections: Not on file  Intimate Partner Violence: Not on file    Review of Systems: See HPI, otherwise negative ROS  Physical Exam: BP 139/79   Pulse 74   Temp (!) 97.5 F (36.4 C) (Temporal)   Ht 5\' 1"  (1.549 m)   Wt 67 kg   SpO2 100%   BMI 27.89 kg/m  General:   Alert,  pleasant and cooperative in NAD Head:  Normocephalic and atraumatic. Neck:  Supple; no masses or thyromegaly. Lungs:  Clear throughout to auscultation, normal respiratory effort.    Heart:  +S1, +S2, Regular rate and rhythm, No edema. Abdomen:  Soft, nontender and nondistended. Normal bowel sounds, without guarding, and without rebound.   Neurologic:  Alert and  oriented x4;  grossly normal neurologically.  Impression/Plan: Brittany Vaughan is here for an endoscopy  to be performed for  evaluation of dysphagia    Risks, benefits, limitations, and alternatives regarding endoscopy have been reviewed with the patient.  Questions have been answered.  All parties agreeable.   Wyline Mood, MD  08/08/2022, 8:09 AM

## 2022-08-09 ENCOUNTER — Encounter: Payer: Self-pay | Admitting: Gastroenterology

## 2022-08-22 ENCOUNTER — Telehealth: Payer: Self-pay

## 2022-08-22 NOTE — Telephone Encounter (Signed)
Patient lvm inquiring about pathology results from EGD 08/08/22.   Returned her phone call to let her know that her message was received and I will forward it to Dr. Johnney Killian CMA-Maritza to ask her to call her back tomorrow.  Thanks, [place], [place]

## 2022-08-23 ENCOUNTER — Other Ambulatory Visit: Payer: Self-pay

## 2022-08-23 DIAGNOSIS — R1319 Other dysphagia: Secondary | ICD-10-CM

## 2022-08-23 MED ORDER — OMEPRAZOLE 40 MG PO CPDR: 40.0000 mg | DELAYED_RELEASE_CAPSULE | Freq: Every day | ORAL | 0 refills | Status: AC

## 2022-08-23 NOTE — Addendum Note (Signed)
Addended by: Adela Ports on: 08/23/2022 09:00 AM   Modules accepted: Orders

## 2022-08-23 NOTE — Telephone Encounter (Signed)
Called patient and she did not answer her call. I was able to leave her a detailed message letting her know that Dr. Tobi Bastos wanted her to start taking Omeprazole 40 MG daily for the next 3 months and then she will stop. In 4 months she will need to repeat her EGD to see if therapy had responded.

## 2022-08-23 NOTE — Telephone Encounter (Signed)
Patient called back and I was able to let her know what Dr. Tobi Bastos wanted her to do. Patient agreed and had no further questions.

## 2022-09-12 ENCOUNTER — Other Ambulatory Visit: Payer: Self-pay | Admitting: Orthopedic Surgery

## 2022-09-12 DIAGNOSIS — M48062 Spinal stenosis, lumbar region with neurogenic claudication: Secondary | ICD-10-CM

## 2022-09-12 DIAGNOSIS — M4125 Other idiopathic scoliosis, thoracolumbar region: Secondary | ICD-10-CM

## 2022-09-12 DIAGNOSIS — M4807 Spinal stenosis, lumbosacral region: Secondary | ICD-10-CM

## 2022-09-12 DIAGNOSIS — M5137 Other intervertebral disc degeneration, lumbosacral region: Secondary | ICD-10-CM

## 2022-09-14 ENCOUNTER — Ambulatory Visit
Admission: RE | Admit: 2022-09-14 | Discharge: 2022-09-14 | Disposition: A | Discharge status: Home / Self Care | Payer: Medicare Other | Source: Ambulatory Visit | Attending: Orthopedic Surgery | Admitting: Orthopedic Surgery

## 2022-09-14 DIAGNOSIS — M4125 Other idiopathic scoliosis, thoracolumbar region: Secondary | ICD-10-CM | POA: Diagnosis present

## 2022-09-14 DIAGNOSIS — M4807 Spinal stenosis, lumbosacral region: Secondary | ICD-10-CM | POA: Insufficient documentation

## 2022-09-14 DIAGNOSIS — M5137 Other intervertebral disc degeneration, lumbosacral region: Secondary | ICD-10-CM | POA: Diagnosis present

## 2022-09-14 DIAGNOSIS — M48062 Spinal stenosis, lumbar region with neurogenic claudication: Secondary | ICD-10-CM | POA: Insufficient documentation

## 2022-09-16 ENCOUNTER — Other Ambulatory Visit: Payer: Medicare Other

## 2022-09-16 ENCOUNTER — Ambulatory Visit: Payer: Medicare Other

## 2022-10-05 ENCOUNTER — Telehealth: Payer: Self-pay | Admitting: Gastroenterology

## 2022-10-05 NOTE — Telephone Encounter (Signed)
Patient called in wanting to know about he appointment on 10/12/22 with Dr. Tobi Bastos.

## 2022-10-12 ENCOUNTER — Ambulatory Visit: Payer: Medicare Other | Admitting: Gastroenterology

## 2022-11-22 ENCOUNTER — Telehealth: Payer: Self-pay | Admitting: Gastroenterology

## 2022-11-22 NOTE — Telephone Encounter (Signed)
Called patient back and asked what her symptoms were and she stated that for the past 2 weeks she has had sore throat and difficulty swallowing. I asked her if she has had a fever or cough and she stated that she had not. I asked if she had contacted her PCP first to be seen and she stated that she had not since it was throat pain. I recommended for her to call and schedule an appointment with her PCP first just to make sure that its not viral. Once she got checked and does not have any viral infection, then to call us back and schedule an appointment with Dr. Tobi Bastos. Patient agreed and had no further questions.

## 2022-11-22 NOTE — Telephone Encounter (Signed)
Pt having severe throat pain for the last 2 weeks

## 2022-12-08 ENCOUNTER — Ambulatory Visit
Admission: RE | Admit: 2022-12-08 | Discharge: 2022-12-08 | Disposition: A | Discharge status: Home / Self Care | Payer: Medicare Other | Attending: Gastroenterology | Admitting: Gastroenterology

## 2022-12-08 ENCOUNTER — Encounter
Admission: RE | Disposition: A | Discharge status: Home / Self Care | Payer: Self-pay | Source: Home / Self Care | Attending: Gastroenterology

## 2022-12-08 ENCOUNTER — Ambulatory Visit: Payer: Medicare Other | Admitting: Anesthesiology

## 2022-12-08 ENCOUNTER — Encounter: Payer: Self-pay | Admitting: Gastroenterology

## 2022-12-08 ENCOUNTER — Other Ambulatory Visit: Payer: Self-pay

## 2022-12-08 DIAGNOSIS — Z09 Encounter for follow-up examination after completed treatment for conditions other than malignant neoplasm: Secondary | ICD-10-CM | POA: Diagnosis not present

## 2022-12-08 DIAGNOSIS — R1319 Other dysphagia: Secondary | ICD-10-CM

## 2022-12-08 DIAGNOSIS — R1310 Dysphagia, unspecified: Secondary | ICD-10-CM | POA: Diagnosis present

## 2022-12-08 DIAGNOSIS — K219 Gastro-esophageal reflux disease without esophagitis: Secondary | ICD-10-CM | POA: Diagnosis not present

## 2022-12-08 DIAGNOSIS — Z9049 Acquired absence of other specified parts of digestive tract: Secondary | ICD-10-CM | POA: Diagnosis not present

## 2022-12-08 DIAGNOSIS — Z79899 Other long term (current) drug therapy: Secondary | ICD-10-CM | POA: Diagnosis not present

## 2022-12-08 DIAGNOSIS — Z87891 Personal history of nicotine dependence: Secondary | ICD-10-CM | POA: Insufficient documentation

## 2022-12-08 DIAGNOSIS — Z8711 Personal history of peptic ulcer disease: Secondary | ICD-10-CM | POA: Diagnosis not present

## 2022-12-08 HISTORY — PX: ESOPHAGOGASTRODUODENOSCOPY (EGD) WITH PROPOFOL: SHX5813

## 2022-12-08 SURGERY — ESOPHAGOGASTRODUODENOSCOPY (EGD) WITH PROPOFOL
Anesthesia: General

## 2022-12-08 MED ORDER — PROPOFOL 1000 MG/100ML IV EMUL
INTRAVENOUS | Status: AC
  Filled 2022-12-08: qty 100

## 2022-12-08 MED ORDER — DEXMEDETOMIDINE HCL IN NACL 80 MCG/20ML IV SOLN
INTRAVENOUS | Status: DC | PRN
Start: 2022-12-08 — End: 2022-12-08
  Administered 2022-12-08: 8 ug via INTRAVENOUS

## 2022-12-08 MED ORDER — LIDOCAINE HCL (CARDIAC) PF 100 MG/5ML IV SOSY
PREFILLED_SYRINGE | INTRAVENOUS | Status: DC | PRN
  Administered 2022-12-08: 40 mg via INTRAVENOUS

## 2022-12-08 MED ORDER — SODIUM CHLORIDE 0.9 % IV SOLN: INTRAVENOUS | Status: DC

## 2022-12-08 MED ORDER — PROPOFOL 10 MG/ML IV BOLUS
INTRAVENOUS | Status: DC | PRN
  Administered 2022-12-08: 70 mg via INTRAVENOUS

## 2022-12-08 NOTE — Op Note (Signed)
[facility] Gastroenterology Patient Name: Brittany Vaughan Procedure Date: 12/08/2022 7:54 AM MRN: [id number] Account #: [id number] Date of Birth: [birth year] Admit Type: Outpatient Age: 67 Room: [facility] ENDO ROOM 4 Gender: Female Note Status: Finalized Instrument Name: Upper Endoscope [id number] Procedure:             Upper GI endoscopy Indications:           Dysphagia Providers:             Wyline Mood MD, MD Referring MD:          Wyline Mood MD, MD (Referring MD), Dortha Kern                         (Referring MD) Medicines:             Monitored Anesthesia Care Complications:         No immediate complications. Procedure:             Pre-Anesthesia Assessment:                        - Prior to the procedure, a History and Physical was                         performed, and patient medications, allergies and                         sensitivities were reviewed. The patient's tolerance                         of previous anesthesia was reviewed.                        - The risks and benefits of the procedure and the                         sedation options and risks were discussed with the                         patient. All questions were answered and informed                         consent was obtained.                        - After reviewing the risks and benefits, the patient                         was deemed in satisfactory condition to undergo the                         procedure.                        - ASA Grade Assessment: II - A patient with mild                         systemic disease.                        After obtaining informed consent, the endoscope was  passed under direct vision. Throughout the procedure,                         the patient's blood pressure, pulse, and oxygen                         saturations were monitored continuously. The Endoscope                         was introduced through the mouth, and  advanced to the                         third part of duodenum. Findings:      The examined duodenum was normal.      The stomach was normal.      The cardia and gastric fundus were normal on retroflexion.      The esophagus was normal. Impression:            - Normal examined duodenum.                        - Normal stomach.                        - Normal esophagus.                        - No specimens collected. Recommendation:        - Discharge patient to home (with escort).                        - Resume previous diet.                        - Continue present medications.                        - Return to GI office as previously scheduled. Procedure Code(s):     --- Professional ---                        [phone], Esophagogastroduodenoscopy, flexible,                         transoral; diagnostic, including collection of                         specimen(s) by brushing or washing, when performed                         (separate procedure) Diagnosis Code(s):     --- Professional ---                        R13.10, Dysphagia, unspecified CPT copyright 2022 American Medical Association. All rights reserved. The codes documented in this report are preliminary and upon coder review may  be revised to meet current compliance requirements. Wyline Mood, MD Wyline Mood MD, MD 12/08/2022 8:04:22 AM This report has been signed electronically. Number of Addenda: 0 Note Initiated On: 12/08/2022 7:54 AM Estimated Blood Loss:  Estimated blood loss: none.      [facility]

## 2022-12-08 NOTE — Anesthesia Postprocedure Evaluation (Signed)
Anesthesia Post Note  Patient: Alixandra Alfieri  Procedure(s) Performed: ESOPHAGOGASTRODUODENOSCOPY (EGD) WITH PROPOFOL  Patient location during evaluation: PACU Anesthesia Type: General Level of consciousness: awake Pain management: satisfactory to patient Vital Signs Assessment: post-procedure vital signs reviewed and stable Respiratory status: nonlabored ventilation Cardiovascular status: blood pressure returned to baseline Anesthetic complications: no   No notable events documented.   Last Vitals:  Vitals:   12/08/22 0816 12/08/22 0826  BP: 105/71 119/65  Pulse: 60 62  Resp:    Temp:    SpO2: 99% 100%    Last Pain:  Vitals:   12/08/22 0826  TempSrc:   PainSc: 0-No pain                 VAN STAVEREN,Lillar Bianca

## 2022-12-08 NOTE — Transfer of Care (Signed)
Immediate Anesthesia Transfer of Care Note  Patient: Brittany Vaughan  Procedure(s) Performed: ESOPHAGOGASTRODUODENOSCOPY (EGD) WITH PROPOFOL  Patient Location: PACU  Anesthesia Type:General  Level of Consciousness: awake, alert , and oriented  Airway & Oxygen Therapy: Patient Spontanous Breathing  Post-op Assessment: Report given to RN and Post -op Vital signs reviewed and stable  Post vital signs: Reviewed and stable  Last Vitals:  Vitals Value Taken Time  BP 119/70 12/08/22 0807  Temp 36.1 C 12/08/22 0806  Pulse 64 12/08/22 0808  Resp 22 12/08/22 0808  SpO2 96 % 12/08/22 0808  Vitals shown include unfiled device data.  Last Pain:  Vitals:   12/08/22 0806  TempSrc: Temporal  PainSc: Asleep         Complications: No notable events documented.

## 2022-12-08 NOTE — H&P (Signed)
Wyline Mood, MD [address], [place], [place], [address] [address], [place], [place], [address] Phone: [phone]  Fax: [phone]  Primary Care Physician:  Dortha Kern, MD   Pre-Procedure History & Physical: HPI:  Brittany Vaughan is a 67 y.o. female is here for an endoscopy    Past Medical History:  Diagnosis Date   Anemia    Arthritis    back   Bursitis of both feet    Chronic bronchitis (HCC)    Chronic cough    PT WITH CURRENT PRODUCTIVE COUGH (GREEN/YELLOWISH) WITHOUT FEVER-PT STATES SHE WILL CALL HER PULMONOLOGIST ON 12-04-16)   Dyspnea    CHRONIC-SEES A PULMONOLOGIST AT [facility]   GERD (gastroesophageal reflux disease)    NO MEDS   H/O gastric ulcer    Lupus    Pneumonia 02/2015   Wears dentures    partial upper    Past Surgical History:  Procedure Laterality Date   CESAREAN SECTION     x2   CHOLECYSTECTOMY  2012   ESOPHAGOGASTRODUODENOSCOPY (EGD) WITH PROPOFOL N/A 08/08/2022   Procedure: ESOPHAGOGASTRODUODENOSCOPY (EGD) WITH PROPOFOL;  Surgeon: Wyline Mood, MD;  Location: [facility] ENDOSCOPY;  Service: Gastroenterology;  Laterality: N/A;  with dilation and with Dr. Servando Snare   FOOT SURGERY Bilateral    x5   MYRINGOTOMY WITH TUBE PLACEMENT Bilateral 07/05/2016   Procedure: MYRINGOTOMY WITH TUBE PLACEMENT WITH BUTTERFLY;  Surgeon: Bud Face, MD;  Location: [facility];  Service: ENT;  Laterality: Bilateral;  BUTTERFLY TUBES   NECK SURGERY  08/2015   [facility]   SHOULDER ARTHROSCOPY WITH OPEN ROTATOR CUFF REPAIR Left 12/11/2016   Procedure: SHOULDER ARTHROSCOPY WITH OPEN ROTATOR CUFF REPAIR;  Surgeon: Lyndle Herrlich, MD;  Location: [facility] ORS;  Service: Orthopedics;  Laterality: Left;   STOMACH SURGERY      Prior to Admission medications   Medication Sig Start Date End Date Taking? Authorizing Provider  albuterol (PROAIR HFA) 108 (90 Base) MCG/ACT inhaler Inhale 2 puffs into the lungs every 6 (six) hours as needed for  wheezing or shortness of breath.    Yes [provider]  omeprazole (PRILOSEC) 40 MG capsule Take 1 capsule (40 mg total) by mouth daily. 08/23/22  Yes Wyline Mood, MD  azelastine (ASTELIN) 0.1 % nasal spray Place 2 sprays into both nostrils 2 (two) times daily. Patient not taking: Reported on 08/08/2022 07/17/12   [provider]  cetirizine (ZYRTEC) 10 MG tablet Take 10 mg by mouth daily as needed for allergies. Patient not taking: Reported on 08/08/2022    [provider]  cyclobenzaprine (FLEXERIL) 10 MG tablet Take 10 mg by mouth at bedtime. Patient not taking: Reported on 08/08/2022    [provider]  ferrous gluconate (FERGON) 324 MG tablet TAKE 1 TABLET BY MOUTH DAILY Patient not taking: Reported on 08/08/2022 11/27/16   [provider]  folic acid (FOLVITE) 1 MG tablet Take 1 mg by mouth daily. Patient not taking: Reported on 08/08/2022 03/29/12   [provider]  furosemide (LASIX) 20 MG tablet Take 20-40 mg by mouth daily as needed. Patient not taking: Reported on 08/08/2022 05/10/22   [provider]  HYDROcodone-acetaminophen (NORCO/VICODIN) 5-325 MG tablet Take 1 tablet by mouth every 6 (six) hours as needed. Patient not taking: Reported on 08/08/2022 04/13/22   [provider]  hydroxychloroquine (PLAQUENIL) 200 MG tablet Take 200 mg by mouth daily. Patient not taking: Reported on 08/08/2022 03/29/12  [provider]  ibuprofen (ADVIL,MOTRIN) 200 MG tablet Take 400 mg by mouth every 6 (six) hours as needed for mild pain.    [provider]  ketorolac (ACULAR) 0.5 % ophthalmic solution Place 1 drop into both eyes 4 (four) times daily. Patient not taking: Reported on 08/08/2022 07/22/12   [provider]  methotrexate (RHEUMATREX) 2.5 MG tablet Take 2.5 mg by mouth once a week. Patient not taking: Reported on 08/08/2022    [provider]  nystatin-triamcinolone ointment (MYCOLOG) Apply 1  Application topically as needed. 06/30/10   [provider]  senna (SENOKOT) 8.6 MG TABS tablet Take 1 tablet (8.6 mg total) by mouth daily. Patient not taking: Reported on 08/08/2022 12/11/16   Lyndle Herrlich, MD  tetrahydrozoline 0.05 % ophthalmic solution Place 1 drop into both eyes daily as needed (DRY EYES).    [provider]    Allergies as of 08/23/2022 - Review Complete 08/08/2022  Allergen Reaction Noted   Sulfa antibiotics Hives 08/11/2015   Cyclobenzaprine Itching 06/29/2016    History reviewed. No pertinent family history.  Social History   Socioeconomic History   Marital status: Married    Spouse name: Not on file   Number of children: Not on file   Years of education: Not on file   Highest education level: Not on file  Occupational History   Not on file  Tobacco Use   Smoking status: Former    Current packs/day: 0.00    Average packs/day: 0.5 packs/day for 2.0 years (1.0 ttl pk-yrs)    Types: Cigarettes    Start date: 12/04/1984    Quit date: 12/05/1986    Years since quitting: 36.0   Smokeless tobacco: Never   Tobacco comments:    smoked for about 2 years, over 40 yrs ago  Vaping Use   Vaping status: Never Used  Substance and Sexual Activity   Alcohol use: No    Alcohol/week: 0.0 standard drinks of alcohol   Drug use: No   Sexual activity: Not on file  Other Topics Concern   Not on file  Social History Narrative   Not on file   Social Determinants of Health   Financial Resource Strain: Not on file  Food Insecurity: Not on file  Transportation Needs: Not on file  Physical Activity: Not on file  Stress: Not on file  Social Connections: Not on file  Intimate Partner Violence: Not on file    Review of Systems: See HPI, otherwise negative ROS  Physical Exam: BP 139/77   Pulse 72   Temp (!) 97.4 F (36.3 C)   Wt 62.6 kg   SpO2 100%   BMI 26.07 kg/m  General:   Alert,  pleasant and cooperative in NAD Head:  Normocephalic  and atraumatic. Neck:  Supple; no masses or thyromegaly. Lungs:  Clear throughout to auscultation, normal respiratory effort.    Heart:  +S1, +S2, Regular rate and rhythm, No edema. Abdomen:  Soft, nontender and nondistended. Normal bowel sounds, without guarding, and without rebound.   Neurologic:  Alert and  oriented x4;  grossly normal neurologically.  Impression/Plan: Brittany Vaughan is here for an endoscopy  to be performed for  evaluation of dysphagia    Risks, benefits, limitations, and alternatives regarding endoscopy have been reviewed with the patient.  Questions have been answered.  All parties agreeable.   Wyline Mood, MD  12/08/2022, 7:48 AM

## 2022-12-08 NOTE — Anesthesia Preprocedure Evaluation (Addendum)
Anesthesia Evaluation  Patient identified by MRN, date of birth, ID band Patient awake    Reviewed: Allergy & Precautions, NPO status , Patient's Chart, lab work & pertinent test results  Airway Mallampati: II  TM Distance: >3 FB Neck ROM: Full    Dental  (+) Edentulous Upper, Edentulous Lower   Pulmonary neg pulmonary ROS, shortness of breath, Patient abstained from smoking., former smoker   Pulmonary exam normal breath sounds clear to auscultation       Cardiovascular Exercise Tolerance: Good negative cardio ROS Normal cardiovascular exam Rhythm:Regular Rate:Normal     Neuro/Psych    Depression    negative neurological ROS  negative psych ROS   GI/Hepatic negative GI ROS, Neg liver ROS,GERD  Medicated,,  Endo/Other  negative endocrine ROS    Renal/GU negative Renal ROS  negative genitourinary   Musculoskeletal   Abdominal Normal abdominal exam  (+)   Peds negative pediatric ROS (+)  Hematology negative hematology ROS (+) Blood dyscrasia, anemia   Anesthesia Other Findings Past Medical History: No date: Anemia No date: Arthritis     Comment:  back No date: Bursitis of both feet No date: Chronic bronchitis (HCC) No date: Chronic cough     Comment:  PT WITH CURRENT PRODUCTIVE COUGH (GREEN/YELLOWISH)               WITHOUT FEVER-PT STATES SHE WILL CALL HER PULMONOLOGIST               ON 12-04-16) No date: Dyspnea     Comment:  CHRONIC-SEES A PULMONOLOGIST AT [facility] No date: GERD (gastroesophageal reflux disease)     Comment:  NO MEDS No date: H/O gastric ulcer No date: Lupus 02/2015: Pneumonia No date: Wears dentures     Comment:  partial upper  Past Surgical History: No date: CESAREAN SECTION     Comment:  x2 2012: CHOLECYSTECTOMY 08/08/2022: ESOPHAGOGASTRODUODENOSCOPY (EGD) WITH PROPOFOL; N/A     Comment:  Procedure: ESOPHAGOGASTRODUODENOSCOPY (EGD) WITH               PROPOFOL;  Surgeon: Wyline Mood, MD;   Location: [facility]               ENDOSCOPY;  Service: Gastroenterology;  Laterality: N/A;               with dilation and with Dr. Servando Snare No date: FOOT SURGERY; Bilateral     Comment:  x5 07/05/2016: MYRINGOTOMY WITH TUBE PLACEMENT; Bilateral     Comment:  Procedure: MYRINGOTOMY WITH TUBE PLACEMENT WITH               BUTTERFLY;  Surgeon: Bud Face, MD;  Location:               [facility];  Service: ENT;  Laterality:               Bilateral;  BUTTERFLY TUBES 08/2015: NECK SURGERY     Comment:  [facility] 12/11/2016: SHOULDER ARTHROSCOPY WITH OPEN ROTATOR CUFF REPAIR; Left     Comment:  Procedure: SHOULDER ARTHROSCOPY WITH OPEN ROTATOR CUFF               REPAIR;  Surgeon: Lyndle Herrlich, MD;  Location: [facility]               ORS;  Service: Orthopedics;  Laterality: Left; No date: STOMACH SURGERY  BMI    Body Mass Index: 26.07 kg/m      Reproductive/Obstetrics negative OB ROS  Anesthesia Physical Anesthesia Plan  ASA: 3  Anesthesia Plan: General   Post-op Pain Management:    Induction: Intravenous  PONV Risk Score and Plan: Propofol infusion and TIVA  Airway Management Planned: Natural Airway  Additional Equipment:   Intra-op Plan:   Post-operative Plan:   Informed Consent: I have reviewed the patients History and Physical, chart, labs and discussed the procedure including the risks, benefits and alternatives for the proposed anesthesia with the patient or authorized representative who has indicated his/her understanding and acceptance.     Dental Advisory Given  Plan Discussed with: Anesthesiologist, CRNA and Surgeon  Anesthesia Plan Comments:        Anesthesia Quick Evaluation

## 2022-12-09 NOTE — Progress Notes (Signed)
Non-identified Voicemail.  No Message Left. 

## 2022-12-11 ENCOUNTER — Encounter: Payer: Self-pay | Admitting: Gastroenterology

## 2022-12-28 ENCOUNTER — Other Ambulatory Visit: Payer: Self-pay | Admitting: Family Medicine

## 2022-12-28 DIAGNOSIS — Z13820 Encounter for screening for osteoporosis: Secondary | ICD-10-CM

## 2022-12-28 DIAGNOSIS — Z780 Asymptomatic menopausal state: Secondary | ICD-10-CM

## 2022-12-28 DIAGNOSIS — Z1231 Encounter for screening mammogram for malignant neoplasm of breast: Secondary | ICD-10-CM

## 2023-03-28 ENCOUNTER — Ambulatory Visit
Admission: RE | Admit: 2023-03-28 | Discharge: 2023-03-28 | Disposition: A | Discharge status: Home / Self Care | Payer: Medicare Other | Attending: Family Medicine | Admitting: Family Medicine

## 2023-03-28 ENCOUNTER — Other Ambulatory Visit: Payer: Self-pay | Admitting: Family Medicine

## 2023-03-28 ENCOUNTER — Ambulatory Visit
Admission: RE | Admit: 2023-03-28 | Discharge: 2023-03-28 | Disposition: A | Discharge status: Home / Self Care | Payer: Medicare Other | Source: Ambulatory Visit | Attending: Family Medicine | Admitting: Family Medicine

## 2023-03-28 DIAGNOSIS — R059 Cough, unspecified: Secondary | ICD-10-CM

## 2023-04-11 ENCOUNTER — Encounter: Payer: Self-pay | Admitting: Family Medicine

## 2023-05-28 ENCOUNTER — Telehealth: Payer: Self-pay | Admitting: Gastroenterology

## 2023-05-28 NOTE — Telephone Encounter (Signed)
 The patient called in to request an appointment for an EGD. She mentioned that her PCP recommended the procedure due to her iron levels dropping. The patient's lab results include:  Hemoglobin: 10.9 (Low)  MCHC: 31.1 (Low)  Glucose: 112 (High)  UIBC: 372 (High)  Iron Saturation: 10 (Low)  Ferritin: 11 (Low)  Vitamin B12: 1451 (High)  The patient is scheduled with Dr. Tobi Bastos on 06/04/23 at 1:45 pm.

## 2023-06-04 ENCOUNTER — Ambulatory Visit: Admitting: Gastroenterology

## 2023-06-04 VITALS — BP 152/74 | HR 72 | Temp 98.1°F | Wt 141.0 lb

## 2023-06-04 DIAGNOSIS — D508 Other iron deficiency anemias: Secondary | ICD-10-CM

## 2023-06-04 DIAGNOSIS — D509 Iron deficiency anemia, unspecified: Secondary | ICD-10-CM | POA: Diagnosis not present

## 2023-06-04 DIAGNOSIS — Z800 Family history of malignant neoplasm of digestive organs: Secondary | ICD-10-CM | POA: Diagnosis not present

## 2023-06-04 NOTE — Progress Notes (Signed)
 Wyline Mood MD, MRCP(U.K) [address]  [address]  [place], [place] [address]  Main: [phone]  Fax: [phone]   Primary Care Physician: Dortha Kern, MD  Primary Gastroenterologist:  Dr. Wyline Mood   Chief Complaint  Patient presents with   IDA    HPI: Brittany Vaughan is a 68 y.o. female    She has send back to see me for iron deficiency anemia labs in February 2025 demonstrated hemoglobin of 10 g and an ferritin of 11.  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  I performed an upper endoscopy in October 2024 no abnormality was seen.   No recent colonoscopy she has never had a colonoscopy.  Her brother had colon cancer.  Denies any overt blood loss.  No nasal bleeds no rectal bleeding no blood in the urine no nasal bleeds no hematemesis.  No change in bowel habits no GI symptoms.  She is taking oral iron.  Denies any NSAID use.  Denies any unintentional weight loss. Current Outpatient Medications  Medication Sig Dispense Refill   albuterol (PROAIR HFA) 108 (90 Base) MCG/ACT inhaler Inhale 2 puffs into the lungs every 6 (six) hours as needed for wheezing or shortness of breath.      azelastine (ASTELIN) 0.1 % nasal spray Place 2 sprays into both nostrils 2 (two) times daily.     cetirizine (ZYRTEC) 10 MG tablet Take 10 mg by mouth daily as needed for allergies.     cyclobenzaprine (FLEXERIL) 10 MG tablet Take 10 mg by mouth at bedtime.     ferrous gluconate (FERGON) 324 MG tablet   0   folic acid (FOLVITE) 1 MG tablet Take 1 mg by mouth daily.     furosemide (LASIX) 20 MG tablet Take 20-40 mg by mouth  daily as needed.     HYDROcodone-acetaminophen (NORCO/VICODIN) 5-325 MG tablet Take 1 tablet by mouth every 6 (six) hours as needed.     hydroxychloroquine (PLAQUENIL) 200 MG tablet Take 200 mg by mouth daily.     ibuprofen (ADVIL,MOTRIN) 200 MG tablet Take 400 mg by mouth every 6 (six) hours as needed for mild pain.     ketorolac (ACULAR) 0.5 % ophthalmic solution Place 1 drop into both eyes 4 (four) times daily.     methotrexate (RHEUMATREX) 2.5 MG tablet Take 2.5 mg by mouth once a week.     nystatin-triamcinolone ointment (MYCOLOG) Apply 1 Application topically as needed.     omeprazole (PRILOSEC) 40 MG capsule Take 1 capsule (40 mg total) by mouth daily. 90 capsule 0   senna (SENOKOT) 8.6 MG TABS tablet Take 1 tablet (8.6 mg total) by mouth daily. 120 each 0   tetrahydrozoline 0.05 % ophthalmic solution Place 1 drop into both eyes daily as needed (DRY EYES).     No current facility-administered medications for this visit.    Allergies as of 06/04/2023 - Review Complete 06/04/2023  Allergen Reaction Noted   Sulfa antibiotics Hives 08/11/2015   Cyclobenzaprine Itching 06/29/2016      ROS:  General: Negative for anorexia, weight loss, fever, chills, fatigue, weakness. ENT: Negative  for hoarseness, difficulty swallowing , nasal congestion. CV: Negative for chest pain, angina, palpitations, dyspnea on exertion, peripheral edema.  Respiratory: Negative for dyspnea at rest, dyspnea on exertion, cough, sputum, wheezing.  GI: See history of present illness. GU:  Negative for dysuria, hematuria, urinary incontinence, urinary frequency, nocturnal urination.  Endo: Negative for unusual weight change.    Physical Examination:   BP (!) 152/74   Pulse 72   Temp 98.1 F (36.7 C) (Oral)   Wt 141 lb (64 kg)   BMI 26.64 kg/m   General: Well-nourished, well-developed in no acute distress.  Eyes: No icterus. Conjunctivae pink. Mouth: Oropharyngeal mucosa moist and pink , no lesions  erythema or exudate. Neuro: Alert and oriented x 3.  Grossly intact. Skin: Warm and dry, no jaundice.   Psych: Alert and cooperative, normal mood and affect.   Imaging Studies: No results found.  Assessment and Plan:   Brittany Vaughan is a 67 y.o. y/o female here to see me for iron deficiency anemia.  She has never had a colonoscopy she has a brother who has had colon cancer I strongly advised that she undergo an EGD and colonoscopy.  She was very reluctant to do so despite me telling her she would be at increased risk for colon cancer and I am very concerned about it.  Plan 1.  Celiac serology H. pylori breath test urine analysis, she will decide and let me know about proceeding with endoscopy procedures and if she agrees we will repeat EGD and colonoscopy to evaluate for iron deficiency anemia.  Advised her to inform me in about a week 2.  Continue oral iron if no improvement in 2 to 3 months may require IV iron.   I have discussed alternative options, risks & benefits,  which include, but are not limited to, bleeding, infection, perforation,respiratory complication & drug reaction.  The patient agrees with this plan & written consent will be obtained.     Dr Wyline Mood  MD,MRCP [facility]) Follow up in 3 months

## 2023-06-06 LAB — B12 AND FOLATE PANEL
Folate: 9.5 ng/mL (ref >3.0)
Vitamin B-12: 1783 pg/mL — ABNORMAL HIGH (ref 232–1245)

## 2023-06-06 NOTE — Telephone Encounter (Signed)
 This had been taken care of and patient was seen by Dr. Tobi Bastos.

## 2023-06-08 ENCOUNTER — Other Ambulatory Visit: Payer: Self-pay

## 2023-06-08 DIAGNOSIS — D508 Other iron deficiency anemias: Secondary | ICD-10-CM

## 2023-06-09 LAB — CELIAC DISEASE AB SCREEN W/RFX
Antigliadin Abs, IgA: 5 U (ref 0–19)
IgA/Immunoglobulin A, Serum: 303 mg/dL (ref 87–352)
Transglutaminase IgA: <2 U/mL (ref 0–3)

## 2023-06-09 LAB — URINALYSIS
Bilirubin, UA: NEGATIVE
Glucose, UA: NEGATIVE
Ketones, UA: NEGATIVE
Leukocytes,UA: NEGATIVE
Nitrite, UA: NEGATIVE
Protein,UA: NEGATIVE
RBC, UA: NEGATIVE
Specific Gravity, UA: 1.007 (ref 1.005–1.030)
Urobilinogen, Ur: 0.2 mg/dL (ref 0.2–1.0)
pH, UA: 6.5 (ref 5.0–7.5)

## 2023-06-09 LAB — H. PYLORI BREATH TEST: H pylori Breath Test: NEGATIVE

## 2023-07-10 ENCOUNTER — Encounter: Payer: Self-pay | Admitting: Gastroenterology

## 2023-07-11 ENCOUNTER — Ambulatory Visit: Admitting: Anesthesiology

## 2023-07-11 ENCOUNTER — Other Ambulatory Visit: Payer: Self-pay

## 2023-07-11 ENCOUNTER — Encounter
Admission: RE | Disposition: A | Discharge status: Home / Self Care | Payer: Self-pay | Source: Home / Self Care | Attending: Gastroenterology

## 2023-07-11 ENCOUNTER — Encounter: Payer: Self-pay | Admitting: Gastroenterology

## 2023-07-11 ENCOUNTER — Ambulatory Visit
Admission: RE | Admit: 2023-07-11 | Discharge: 2023-07-11 | Disposition: A | Discharge status: Home / Self Care | Attending: Gastroenterology | Admitting: Gastroenterology

## 2023-07-11 DIAGNOSIS — D508 Other iron deficiency anemias: Secondary | ICD-10-CM | POA: Diagnosis present

## 2023-07-11 DIAGNOSIS — D509 Iron deficiency anemia, unspecified: Secondary | ICD-10-CM | POA: Insufficient documentation

## 2023-07-11 DIAGNOSIS — Z87891 Personal history of nicotine dependence: Secondary | ICD-10-CM | POA: Insufficient documentation

## 2023-07-11 DIAGNOSIS — K3189 Other diseases of stomach and duodenum: Secondary | ICD-10-CM | POA: Insufficient documentation

## 2023-07-11 DIAGNOSIS — D649 Anemia, unspecified: Secondary | ICD-10-CM

## 2023-07-11 DIAGNOSIS — Z9884 Bariatric surgery status: Secondary | ICD-10-CM | POA: Diagnosis not present

## 2023-07-11 DIAGNOSIS — K2950 Unspecified chronic gastritis without bleeding: Secondary | ICD-10-CM | POA: Diagnosis not present

## 2023-07-11 HISTORY — PX: ESOPHAGOGASTRODUODENOSCOPY: SHX5428

## 2023-07-11 SURGERY — EGD (ESOPHAGOGASTRODUODENOSCOPY)
Anesthesia: General

## 2023-07-11 MED ORDER — GLYCOPYRROLATE 0.2 MG/ML IJ SOLN
INTRAMUSCULAR | Status: DC | PRN
  Administered 2023-07-11: .2 mg via INTRAVENOUS

## 2023-07-11 MED ORDER — DEXMEDETOMIDINE HCL IN NACL 80 MCG/20ML IV SOLN
INTRAVENOUS | Status: AC
Start: 2023-07-11 — End: ?
  Filled 2023-07-11: qty 20

## 2023-07-11 MED ORDER — SODIUM CHLORIDE 0.9 % IV SOLN
INTRAVENOUS | Status: DC
Start: 2023-07-11 — End: 2023-07-11

## 2023-07-11 MED ORDER — LIDOCAINE HCL (PF) 2 % IJ SOLN
INTRAMUSCULAR | Status: AC
  Filled 2023-07-11: qty 20

## 2023-07-11 MED ORDER — LIDOCAINE HCL (CARDIAC) PF 100 MG/5ML IV SOSY
PREFILLED_SYRINGE | INTRAVENOUS | Status: DC | PRN
  Administered 2023-07-11: 60 mg via INTRAVENOUS

## 2023-07-11 MED ORDER — DEXMEDETOMIDINE HCL IN NACL 80 MCG/20ML IV SOLN
INTRAVENOUS | Status: DC | PRN
  Administered 2023-07-11: 20 ug via INTRAVENOUS

## 2023-07-11 MED ORDER — PROPOFOL 500 MG/50ML IV EMUL
INTRAVENOUS | Status: DC | PRN
  Administered 2023-07-11: 75 ug/kg/min via INTRAVENOUS

## 2023-07-11 MED ORDER — LIDOCAINE HCL (PF) 2 % IJ SOLN
INTRAMUSCULAR | Status: AC
  Filled 2023-07-11: qty 5

## 2023-07-11 MED ORDER — PROPOFOL 10 MG/ML IV BOLUS
INTRAVENOUS | Status: DC | PRN
  Administered 2023-07-11: 50 mg via INTRAVENOUS

## 2023-07-11 MED ORDER — GLYCOPYRROLATE 0.2 MG/ML IJ SOLN
INTRAMUSCULAR | Status: AC
  Filled 2023-07-11: qty 1

## 2023-07-11 MED ORDER — PROPOFOL 1000 MG/100ML IV EMUL
INTRAVENOUS | Status: AC
  Filled 2023-07-11: qty 100

## 2023-07-11 NOTE — H&P (Signed)
 Luke Salaam, MD [address], [place], [place], [address] [address], [address], [place], [place], [address] Phone: [phone]  Fax: [phone]  Primary Care Physician:  Claudine Cullens, MD   Pre-Procedure History & Physical: HPI:  Brittany Vaughan is a 69 y.o. female is here for an endoscopy    Past Medical History:  Diagnosis Date   Anemia    Arthritis    back   Bursitis of both feet    Chronic bronchitis (HCC)    Chronic cough    PT WITH CURRENT PRODUCTIVE COUGH (GREEN/YELLOWISH) WITHOUT FEVER-PT STATES SHE WILL CALL HER PULMONOLOGIST ON 12-04-16)   Dyspnea    CHRONIC-SEES A PULMONOLOGIST AT [facility]   GERD (gastroesophageal reflux disease)    NO MEDS   H/O gastric ulcer    Lupus    Pneumonia 02/2015   Wears dentures    partial upper    Past Surgical History:  Procedure Laterality Date   CESAREAN SECTION     x2   CHOLECYSTECTOMY  2012   ESOPHAGOGASTRODUODENOSCOPY (EGD) WITH PROPOFOL  N/A 08/08/2022   Procedure: ESOPHAGOGASTRODUODENOSCOPY (EGD) WITH PROPOFOL ;  Surgeon: Luke Salaam, MD;  Location: [facility] ENDOSCOPY;  Service: Gastroenterology;  Laterality: N/A;  with dilation and with Dr. Ole Berkeley   ESOPHAGOGASTRODUODENOSCOPY (EGD) WITH PROPOFOL  N/A 12/08/2022   Procedure: ESOPHAGOGASTRODUODENOSCOPY (EGD) WITH PROPOFOL ;  Surgeon: Luke Salaam, MD;  Location[facility] ENDOSCOPY;  Service: Gastroenterology;  Laterality: N/A;   FOOT SURGERY Bilateral    x5   MYRINGOTOMY WITH TUBE PLACEMENT Bilateral 07/05/2016   Procedure: MYRINGOTOMY WITH TUBE PLACEMENT WITH BUTTERFLY;  Surgeon: Rogers Clayman, MD;  Location: [facility];  Service: ENT;  Laterality: Bilateral;  BUTTERFLY TUBES   NECK SURGERY  08/2015   [facility]   SHOULDER ARTHROSCOPY WITH OPEN ROTATOR CUFF REPAIR Left 12/11/2016   Procedure: SHOULDER ARTHROSCOPY WITH OPEN ROTATOR CUFF REPAIR;  Surgeon: Jerlyn Moons, MD;  Location: [facility] ORS;  Service: Orthopedics;  Laterality: Left;   STOMACH  SURGERY      Prior to Admission medications   Medication Sig Start Date End Date Taking? Authorizing Provider  albuterol (PROAIR HFA) 108 (90 Base) MCG/ACT inhaler Inhale 2 puffs into the lungs every 6 (six) hours as needed for wheezing or shortness of breath.    Yes [provider]  cetirizine (ZYRTEC) 10 MG tablet Take 10 mg by mouth daily as needed for allergies.   Yes [provider]  ferrous gluconate (FERGON) 324 MG tablet  11/27/16  Yes [provider]  omeprazole  (PRILOSEC) 40 MG capsule Take 1 capsule (40 mg total) by mouth daily. 08/23/22  Yes Kamera Dubas, MD  azelastine (ASTELIN) 0.1 % nasal spray Place 2 sprays into both nostrils 2 (two) times daily. 07/17/12   [provider]  cyclobenzaprine (FLEXERIL) 10 MG tablet Take 10 mg by mouth at bedtime. Patient not taking: Reported on 07/11/2023    [provider]  folic acid (FOLVITE) 1 MG tablet Take 1 mg by mouth daily. Patient not taking: Reported on 07/11/2023 03/29/12   [provider]  furosemide (LASIX) 20 MG tablet Take 20-40 mg by mouth daily as needed. Patient not taking: Reported on 07/11/2023 05/10/22   [provider]  HYDROcodone -acetaminophen  (NORCO/VICODIN) 5-325 MG tablet Take 1 tablet by mouth every 6 (six) hours as needed. Patient not taking: Reported on 07/11/2023 04/13/22   [provider]  hydroxychloroquine (PLAQUENIL) 200 MG tablet Take 200 mg by mouth daily. Patient  not taking: Reported on 07/11/2023 03/29/12   [provider]  ibuprofen (ADVIL,MOTRIN) 200 MG tablet Take 400 mg by mouth every 6 (six) hours as needed for mild pain.    [provider]  ketorolac (ACULAR) 0.5 % ophthalmic solution Place 1 drop into both eyes 4 (four) times daily. Patient not taking: Reported on 07/11/2023 07/22/12   [provider]  methotrexate (RHEUMATREX) 2.5 MG tablet Take 2.5 mg by mouth once a week. Patient not taking: Reported on 07/11/2023     [provider]  nystatin-triamcinolone  ointment (MYCOLOG) Apply 1 Application topically as needed. 06/30/10   [provider]  senna (SENOKOT) 8.6 MG TABS tablet Take 1 tablet (8.6 mg total) by mouth daily. 12/11/16   Jerlyn Moons, MD  tetrahydrozoline 0.05 % ophthalmic solution Place 1 drop into both eyes daily as needed (DRY EYES).    [provider]    Allergies as of 06/08/2023 - Review Complete 06/04/2023  Allergen Reaction Noted   Sulfa antibiotics Hives 08/11/2015   Cyclobenzaprine Itching 06/29/2016    History reviewed. No pertinent family history.  Social History   Socioeconomic History   Marital status: Married    Spouse name: Not on file   Number of children: Not on file   Years of education: Not on file   Highest education level: Not on file  Occupational History   Not on file  Tobacco Use   Smoking status: Former    Current packs/day: 0.00    Average packs/day: 0.5 packs/day for 2.0 years (1.0 ttl pk-yrs)    Types: Cigarettes    Start date: 12/04/1984    Quit date: 12/05/1986    Years since quitting: 36.6   Smokeless tobacco: Never   Tobacco comments:    smoked for about 2 years, over 40 yrs ago  Vaping Use   Vaping status: Never Used  Substance and Sexual Activity   Alcohol use: No    Alcohol/week: 0.0 standard drinks of alcohol   Drug use: No   Sexual activity: Not on file  Other Topics Concern   Not on file  Social History Narrative   Not on file   Social Drivers of Health   Financial Resource Strain: Not on file  Food Insecurity: Not on file  Transportation Needs: Not on file  Physical Activity: Not on file  Stress: Not on file  Social Connections: Not on file  Intimate Partner Violence: Not on file    Review of Systems: See HPI, otherwise negative ROS  Physical Exam: BP 125/77   Pulse 73   Temp 98 F (36.7 C) (Temporal)   Resp 18   Ht 5\' 1"  (1.549 m)   Wt 63.1 kg   SpO2 100%   BMI 26.30 kg/m   General:   Alert,  pleasant and cooperative in NAD Head:  Normocephalic and atraumatic. Neck:  Supple; no masses or thyromegaly. Lungs:  Clear throughout to auscultation, normal respiratory effort.    Heart:  +S1, +S2, Regular rate and rhythm, No edema. Abdomen:  Soft, nontender and nondistended. Normal bowel sounds, without guarding, and without rebound.   Neurologic:  Alert and  oriented x4;  grossly normal neurologically.  Impression/Plan: Brittany Vaughan is here for an endoscopy  to be performed for  evaluation of iron deficiency anemia. She refused a colonoscopy although suggested.     Risks, benefits, limitations, and alternatives regarding endoscopy have been reviewed with the patient.  Questions have been answered.  All parties  agreeable.   Luke Salaam, MD  07/11/2023, 9:37 AM

## 2023-07-11 NOTE — Transfer of Care (Signed)
 Immediate Anesthesia Transfer of Care Note  Patient: Brittany Vaughan  Procedure(s) Performed: EGD (ESOPHAGOGASTRODUODENOSCOPY)  Patient Location: PACU  Anesthesia Type:General  Level of Consciousness: sedated  Airway & Oxygen Therapy: Patient Spontanous Breathing  Post-op Assessment: Report given to RN and Post -op Vital signs reviewed and stable  Post vital signs: Reviewed and stable  Last Vitals:  Vitals Value Taken Time  BP 120/68 07/11/23 0957  Temp    Pulse 67 07/11/23 0957  Resp 15 07/11/23 0957  SpO2 97 % 07/11/23 0957  Vitals shown include unfiled device data.  Last Pain:  Vitals:   07/11/23 0931  TempSrc: Temporal  PainSc: 0-No pain         Complications: No notable events documented.

## 2023-07-11 NOTE — Anesthesia Preprocedure Evaluation (Addendum)
 Anesthesia Evaluation  Patient identified by MRN, date of birth, ID band Patient awake    Reviewed: Allergy & Precautions, NPO status , Patient's Chart, lab work & pertinent test results  History of Anesthesia Complications Negative for: history of anesthetic complications  Airway Mallampati: I   Neck ROM: Full    Dental  (+) Upper Dentures, Lower Dentures   Pulmonary former smoker (quit 1988)   Pulmonary exam normal breath sounds clear to auscultation       Cardiovascular negative cardio ROS Normal cardiovascular exam Rhythm:Regular Rate:Normal     Neuro/Psych  PSYCHIATRIC DISORDERS  Depression    negative neurological ROS     GI/Hepatic PUD,GERD  ,,  Endo/Other  negative endocrine ROS    Renal/GU negative Renal ROS     Musculoskeletal  (+) Arthritis ,    Abdominal   Peds  Hematology  (+) Blood dyscrasia, anemia   Anesthesia Other Findings   Reproductive/Obstetrics                             Anesthesia Physical Anesthesia Plan  ASA: 2  Anesthesia Plan: General   Post-op Pain Management:    Induction: Intravenous  PONV Risk Score and Plan: 3 and Propofol  infusion, TIVA and Treatment may vary due to age or medical condition  Airway Management Planned: Natural Airway  Additional Equipment:   Intra-op Plan:   Post-operative Plan:   Informed Consent: I have reviewed the patients History and Physical, chart, labs and discussed the procedure including the risks, benefits and alternatives for the proposed anesthesia with the patient or authorized representative who has indicated his/her understanding and acceptance.       Plan Discussed with: CRNA  Anesthesia Plan Comments: (LMA/GETA backup discussed.  Patient consented for risks of anesthesia including but not limited to:  - adverse reactions to medications - damage to eyes, teeth, lips or other oral mucosa - nerve  damage due to positioning  - sore throat or hoarseness - damage to heart, brain, nerves, lungs, other parts of body or loss of life  Informed patient about role of CRNA in peri- and intra-operative care.  Patient voiced understanding.)        Anesthesia Quick Evaluation

## 2023-07-11 NOTE — Anesthesia Postprocedure Evaluation (Signed)
 Anesthesia Post Note  Patient: Brittany Vaughan  Procedure(s) Performed: EGD (ESOPHAGOGASTRODUODENOSCOPY)  Patient location during evaluation: PACU Anesthesia Type: General Level of consciousness: awake and alert, oriented and patient cooperative Pain management: pain level controlled Vital Signs Assessment: post-procedure vital signs reviewed and stable Respiratory status: spontaneous breathing, nonlabored ventilation and respiratory function stable Cardiovascular status: blood pressure returned to baseline and stable Postop Assessment: adequate PO intake Anesthetic complications: no   No notable events documented.   Last Vitals:  Vitals:   07/11/23 1011 07/11/23 1016  BP:  113/75  Pulse: 67 67  Resp: 15 18  Temp:    SpO2: 97% 97%    Last Pain:  Vitals:   07/11/23 0950  TempSrc: Temporal  PainSc:                  Dorothey Gate

## 2023-07-11 NOTE — Op Note (Signed)
 [facility] Gastroenterology Patient Name: Brittany Vaughan Procedure Date: 07/11/2023 9:31 AM MRN: [id number] Account #: [id number] Date of Birth: 03-11-1955 Admit Type: Outpatient Age: 68 Room: [facility] ENDO ROOM 3 Gender: Female Note Status: Finalized Instrument Name: Upper Endoscope [id number] Procedure:             Upper GI endoscopy Indications:           Iron deficiency anemia Providers:             Luke Salaam MD, MD Referring MD:          Luke Salaam MD, MD (Referring MD), Claudine Cullens                         (Referring MD) Medicines:             Monitored Anesthesia Care Complications:         No immediate complications. Procedure:             Pre-Anesthesia Assessment:                        - Prior to the procedure, a History and Physical was                         performed, and patient medications, allergies and                         sensitivities were reviewed. The patient's tolerance                         of previous anesthesia was reviewed.                        - The risks and benefits of the procedure and the                         sedation options and risks were discussed with the                         patient. All questions were answered and informed                         consent was obtained.                        - ASA Grade Assessment: II - A patient with mild                         systemic disease.                        After obtaining informed consent, the endoscope was                         passed under direct vision. Throughout the procedure,                         the patient's blood pressure, pulse, and oxygen                         saturations  were monitored continuously. The Endoscope                         was introduced through the mouth, and advanced to the                         jejunum. The upper GI endoscopy was accomplished with                         ease. The patient tolerated the procedure well. Findings:       The esophagus was normal.      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 This was       characterized by healthy appearing mucosa.      Diffuse moderately erythematous mucosa without bleeding was found in the       entire examined stomach. Biopsies were taken with a cold forceps for       histology.      The examined jejunum was normal.      The cardia and gastric fundus were normal on retroflexion. Impression:            - Normal esophagus.                        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                        - Erythematous mucosa in the stomach. Biopsied.                        - Normal examined jejunum. Recommendation:        - Discharge patient to home (with escort).                        - Resume previous diet.                        - Continue present medications.                        - Await pathology results.                        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 All rights reserved. The codes documented in this report are preliminary and upon coder review may  be revised to meet current compliance requirements. Luke Salaam, MD Luke Salaam MD, MD 07/11/2023 9:57:30 AM This report has been signed electronically. Number of Addenda: 0 Note Initiated On: 07/11/2023 9:31 AM Estimated Blood Loss:  Estimated blood loss: none.      [facility]

## 2023-07-12 ENCOUNTER — Encounter: Payer: Self-pay | Admitting: Gastroenterology

## 2023-07-14 LAB — SURGICAL PATHOLOGY

## 2023-07-15 ENCOUNTER — Ambulatory Visit: Payer: Self-pay | Admitting: Gastroenterology

## 2023-07-16 ENCOUNTER — Telehealth: Payer: Self-pay

## 2023-07-16 NOTE — Telephone Encounter (Signed)
 Left message for patient to return call - per Dr.Anna Suggest Prilosec 40 mg daily OTC. No Nsaids.

## 2023-07-18 NOTE — Telephone Encounter (Signed)
 Patient called back and I let her know what Louanna Rouse Fayetta Hoose) wanted the patient to know aboit her procedure results and to start taking Prilosec 40 MG once at night. Patient understood to stop taking her Nexium and start her OTC Prilosec. Patient had no further questions.

## 2023-07-24 ENCOUNTER — Other Ambulatory Visit: Payer: Self-pay | Admitting: Otolaryngology

## 2023-07-24 DIAGNOSIS — H9201 Otalgia, right ear: Secondary | ICD-10-CM

## 2023-07-24 DIAGNOSIS — H908 Mixed conductive and sensorineural hearing loss, unspecified: Secondary | ICD-10-CM

## 2023-07-27 ENCOUNTER — Ambulatory Visit
Admission: RE | Admit: 2023-07-27 | Discharge: 2023-07-27 | Disposition: A | Discharge status: Home / Self Care | Source: Ambulatory Visit | Attending: Otolaryngology | Admitting: Otolaryngology

## 2023-07-27 DIAGNOSIS — H9201 Otalgia, right ear: Secondary | ICD-10-CM

## 2023-07-27 DIAGNOSIS — H908 Mixed conductive and sensorineural hearing loss, unspecified: Secondary | ICD-10-CM

## 2023-08-06 ENCOUNTER — Telehealth: Payer: Self-pay

## 2023-08-06 NOTE — Telephone Encounter (Signed)
 okay

## 2023-08-06 NOTE — Telephone Encounter (Signed)
 Pt requesting call back to schedule colonoscopy.
